# Patient Record
Sex: Male | Born: 1940 | Race: White | Hispanic: No | Marital: Married
Health system: Southern US, Community
[De-identification: ages and names within clinical notes are randomized; demographics above are authoritative.]

## PROBLEM LIST (undated history)

## (undated) DIAGNOSIS — M1990 Unspecified osteoarthritis, unspecified site: Secondary | ICD-10-CM

## (undated) DIAGNOSIS — E785 Hyperlipidemia, unspecified: Secondary | ICD-10-CM

## (undated) DIAGNOSIS — D229 Melanocytic nevi, unspecified: Secondary | ICD-10-CM

## (undated) DIAGNOSIS — C4491 Basal cell carcinoma of skin, unspecified: Secondary | ICD-10-CM

## (undated) DIAGNOSIS — I10 Essential (primary) hypertension: Secondary | ICD-10-CM

## (undated) DIAGNOSIS — C4492 Squamous cell carcinoma of skin, unspecified: Secondary | ICD-10-CM

## (undated) DIAGNOSIS — H3530 Unspecified macular degeneration: Secondary | ICD-10-CM

## (undated) DIAGNOSIS — H269 Unspecified cataract: Secondary | ICD-10-CM

## (undated) DIAGNOSIS — D099 Carcinoma in situ, unspecified: Secondary | ICD-10-CM

## (undated) HISTORY — DX: Unspecified macular degeneration: H35.30

## (undated) HISTORY — DX: Unspecified osteoarthritis, unspecified site: M19.90

## (undated) HISTORY — DX: Carcinoma in situ, unspecified: D09.9

## (undated) HISTORY — DX: Basal cell carcinoma of skin, unspecified: C44.91

## (undated) HISTORY — PX: PROSTATE SURGERY: SHX751

## (undated) HISTORY — PX: CATARACT EXTRACTION, BILATERAL: SHX1313

## (undated) HISTORY — DX: Hyperlipidemia, unspecified: E78.5

## (undated) HISTORY — DX: Melanocytic nevi, unspecified: D22.9

## (undated) HISTORY — DX: Unspecified cataract: H26.9

## (undated) HISTORY — DX: Squamous cell carcinoma of skin, unspecified: C44.92

---
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
## 1983-02-17 HISTORY — PX: BACK SURGERY: SHX140

## 1983-02-17 HISTORY — PX: INGUINAL HERNIA REPAIR: SUR1180

## 1997-02-16 HISTORY — PX: COLONOSCOPY: SHX174

## 2000-07-18 ENCOUNTER — Emergency Department (HOSPITAL_COMMUNITY): Admission: EM | Admit: 2000-07-18 | Discharge: 2000-07-18 | Payer: Self-pay | Admitting: Emergency Medicine

## 2001-02-16 HISTORY — PX: COLONOSCOPY: SHX174

## 2001-06-01 ENCOUNTER — Ambulatory Visit (HOSPITAL_COMMUNITY): Admission: RE | Admit: 2001-06-01 | Discharge: 2001-06-01 | Payer: Self-pay | Admitting: Internal Medicine

## 2002-08-23 DIAGNOSIS — C4491 Basal cell carcinoma of skin, unspecified: Secondary | ICD-10-CM

## 2002-08-23 HISTORY — DX: Basal cell carcinoma of skin, unspecified: C44.91

## 2002-10-24 DIAGNOSIS — D099 Carcinoma in situ, unspecified: Secondary | ICD-10-CM

## 2002-10-24 HISTORY — DX: Carcinoma in situ, unspecified: D09.9

## 2004-06-17 ENCOUNTER — Ambulatory Visit: Payer: Self-pay | Admitting: *Deleted

## 2004-06-20 ENCOUNTER — Ambulatory Visit: Payer: Self-pay | Admitting: *Deleted

## 2004-06-20 ENCOUNTER — Ambulatory Visit (HOSPITAL_COMMUNITY): Admission: RE | Admit: 2004-06-20 | Discharge: 2004-06-20 | Payer: Self-pay | Admitting: *Deleted

## 2004-07-02 ENCOUNTER — Ambulatory Visit: Payer: Self-pay | Admitting: *Deleted

## 2004-07-03 ENCOUNTER — Ambulatory Visit (HOSPITAL_COMMUNITY): Admission: RE | Admit: 2004-07-03 | Discharge: 2004-07-03 | Payer: Self-pay | Admitting: Family Medicine

## 2004-07-03 ENCOUNTER — Ambulatory Visit: Payer: Self-pay | Admitting: *Deleted

## 2004-07-10 ENCOUNTER — Ambulatory Visit: Payer: Self-pay | Admitting: *Deleted

## 2006-01-04 ENCOUNTER — Ambulatory Visit: Payer: Self-pay | Admitting: Cardiovascular Disease

## 2007-09-12 ENCOUNTER — Encounter: Admission: RE | Admit: 2007-09-12 | Discharge: 2007-09-12 | Payer: Self-pay | Admitting: Otolaryngology

## 2008-03-30 ENCOUNTER — Ambulatory Visit: Payer: Self-pay | Admitting: Cardiology

## 2008-04-18 ENCOUNTER — Ambulatory Visit (HOSPITAL_COMMUNITY): Admission: RE | Admit: 2008-04-18 | Discharge: 2008-04-18 | Payer: Self-pay | Admitting: Family Medicine

## 2008-09-04 DIAGNOSIS — D229 Melanocytic nevi, unspecified: Secondary | ICD-10-CM

## 2008-09-04 HISTORY — DX: Melanocytic nevi, unspecified: D22.9

## 2009-04-08 ENCOUNTER — Encounter: Payer: Self-pay | Admitting: Cardiology

## 2009-04-30 ENCOUNTER — Encounter: Payer: Self-pay | Admitting: Internal Medicine

## 2009-05-09 ENCOUNTER — Encounter (INDEPENDENT_AMBULATORY_CARE_PROVIDER_SITE_OTHER): Payer: Self-pay | Admitting: *Deleted

## 2009-05-09 DIAGNOSIS — E785 Hyperlipidemia, unspecified: Secondary | ICD-10-CM | POA: Insufficient documentation

## 2009-05-10 ENCOUNTER — Ambulatory Visit (HOSPITAL_COMMUNITY): Admission: RE | Admit: 2009-05-10 | Discharge: 2009-05-10 | Payer: Self-pay | Admitting: Internal Medicine

## 2009-05-10 ENCOUNTER — Ambulatory Visit: Payer: Self-pay | Admitting: Internal Medicine

## 2009-05-10 HISTORY — PX: COLONOSCOPY: SHX174

## 2009-05-16 ENCOUNTER — Encounter: Payer: Self-pay | Admitting: Cardiology

## 2009-05-16 LAB — CONVERTED CEMR LAB
Alkaline Phosphatase: 92 units/L (ref 39–117)
CO2: 24 meq/L (ref 19–32)
Glucose, Bld: 99 mg/dL (ref 70–99)
Potassium: 4.1 meq/L (ref 3.5–5.3)
Total Bilirubin: 0.5 mg/dL (ref 0.3–1.2)
Triglycerides: 129 mg/dL (ref <150)

## 2009-05-21 ENCOUNTER — Encounter (INDEPENDENT_AMBULATORY_CARE_PROVIDER_SITE_OTHER): Payer: Self-pay | Admitting: *Deleted

## 2009-09-16 ENCOUNTER — Telehealth (INDEPENDENT_AMBULATORY_CARE_PROVIDER_SITE_OTHER): Payer: Self-pay | Admitting: *Deleted

## 2010-03-18 NOTE — Progress Notes (Signed)
Summary: rx refill  Medications Added LIPITOR 20 MG TABS (ATORVASTATIN CALCIUM) Take one tablet by mouth daily. ASPIR-LOW 81 MG TBEC (ASPIRIN) take 1 tab daily CALCIUM-VITAMIN D 250-125 MG-UNIT TABS (CALCIUM CARBONATE-VITAMIN D) take 1 tab daily VITAMIN E 1000 UNIT CAPS (VITAMIN E) take 1 tab daily TYLENOL 325 MG TABS (ACETAMINOPHEN) take as needed OCUVITE PRESERVISION  TABS (MULTIPLE VITAMINS-MINERALS) take 2 tabs two times a day       Phone Note Call from Patient Call back at [phone]   Caller: pt Reason for Call: Refill Medication Summary of Call: pt needs lipitor 20 mg [facility] [place]. Initial call taken by: Faythe Ghee,  September 16, 2009 12:23 PM  Follow-up for Phone Call        [facility] for pt to call our office for a follow up appt.  This is his 2nd refill, we will not refill this med again without an appt Follow-up by: Teressa Lower RN,  September 16, 2009 1:08 PM    Prescriptions: LIPITOR 20 MG TABS (ATORVASTATIN CALCIUM) Take one tablet by mouth daily.  #30 x 0   Entered by:   Teressa Lower RN   Authorized by:   Kathlen Brunswick, MD, [facility]   Signed by:   Teressa Lower RN on 09/16/2009   Method used:   Electronically to        [facility] (retail)       [address]       [place], [place]  [address]       Ph: [phone]       Fax: [phone]   RxID:   [id number]

## 2010-03-18 NOTE — Letter (Signed)
Summary: [place] [facility] Work Engineer, agricultural at [employer]  [address] [place] [address]   Phone: [phone]  Fax: [phone]     May 09, 2009 MRN: [id number]   ODYN TURKO [address] [place], [place]  [address]      YOUR LAB WORK IS DUE   _____________March 28, 2011____________________________  Please go to [facility], located across the street from [facility] on the second floor.  Hours are Monday - Friday 7am until 7:30pm         Saturday 8am until 12noon    _x_  DO NOT EAT OR DRINK AFTER MIDNIGHT EVENING PRIOR TO LABWORK  __ YOUR LABWORK IS NOT FASTING --YOU MAY EAT PRIOR TO LABWORK

## 2010-03-18 NOTE — Letter (Signed)
Summary: [place] Results Engineer, agricultural at [facility]  [address] [place] [address]   Phone: [phone]  Fax: [phone]      May 21, 2009 MRN: [id number]   Jose King [address] [place], [place]  [address]   Dear Mr. Hilliker,  Your test ordered by Selena Batten has been reviewed by your physician (or physician assistant) and was found to be normal or stable. Your physician (or physician assistant) felt no changes were needed at this time.  ____ Echocardiogram  ____ Cardiac Stress Test  __x__ Lab Work  ____ Peripheral vascular study of arms, legs or neck  ____ CT scan or X-ray  ____ Lung or Breathing test  ____ Other:  No change in medical treatment at this time, per Dr. Dietrich Pates.  Enclosed is a copy of your labwork for your records.  Thank you, Jose King Allyne Gee RN    Hanover Bing, MD, [facility] Arena.C.Gaylord Shih, MD, F.A.C.C Lewayne Bunting, MD, F.A.C.C Nona Dell, MD, F.A.C.C Charlton Haws, MD, Lenise Arena.C.C

## 2010-03-18 NOTE — Miscellaneous (Signed)
Summary: lipitor refill  Clinical Lists Changes  Medications: Added new medication of LIPITOR 20 MG TABS (ATORVASTATIN CALCIUM) Take one tablet by mouth daily. - Signed Rx of LIPITOR 20 MG TABS (ATORVASTATIN CALCIUM) Take one tablet by mouth daily.;  #30 x 1;  Signed;  Entered by: Teressa Lower RN;  Authorized by: Kathlen Brunswick, MD, [facility];  Method used: Electronically to [facility] [address] [place], [place], [place]  [address], Ph: [phone], Fax: [phone]    Prescriptions: LIPITOR 20 MG TABS (ATORVASTATIN CALCIUM) Take one tablet by mouth daily.  #30 x 1   Entered by:   Teressa Lower RN   Authorized by:   Kathlen Brunswick, MD, [facility]   Signed by:   Teressa Lower RN on 04/08/2009   Method used:   Electronically to        [facility] (retail)       [address]       [place], [place]  [address]       Ph: [phone]       Fax: [phone]   RxID:   [id number]

## 2010-03-18 NOTE — Letter (Signed)
Summary: Internal Other Domingo Dimes  Internal Other Domingo Dimes   Imported By: Cloria Spring LPN 16/11/9602 54:09:81  _____________________________________________________________________  External Attachment:    Type:   Image     Comment:   External Document

## 2010-07-01 NOTE — Letter (Signed)
March 30, 2008    Angus G. Renard Matter, MD  [address]  [place], [place] [address]   RE:  PARMVIR, BOOMER  MRN:  [id number]  /  DOB:  Apr 09, 1940   Dear Thalia Party,   Mr. Mcgann returns to the office after a 2-1/2-year hiatus.  He was  scheduled to see Dr. Eden Emms back in 2008, but missed that appointment.  We are in the process of updating our records and called him in because  he was overdue for followup.  As you know, he has done quite well in the  intervening years.  He is very active including splitting wood with a  hand maul with no difficulty.  He was originally evaluated for  hyperlipidemia with a strong family history for coronary artery disease.  Most of the members of his family who suffered myocardial infarction,  they used tobacco products, and he does not.  He has continued to take  atorvastatin 20 mg daily, but has not had a recent lipid profile.  He  also takes aspirin 81 mg daily, calcium with vitamin D, vitamin E, and a  vitamin tablet designed to forestall visual problems.   PHYSICAL EXAMINATION:  GENERAL:  A very pleasant gentleman in no acute  distress.  VITAL SIGNS:  The weight is 198, 4 pounds more than in 2007.  Blood  pressure 120/80, heart rate 70 and regular, respirations 14 and  unlabored.  NECK:  Slight jugular venous distention; normal carotid upstrokes  without bruits.  LUNGS:  Clear.  CARDIAC:  Normal first and second heart sounds; fourth heart sound  present; minimal systolic murmur.  ABDOMEN:  Soft and nontender; no organomegaly; no bruits.  EXTREMITIES:  Distal pulses are intact; no edema.   EKG:  Normal sinus rhythm; delayed R-wave progression - I cannot exclude  previous anteroseptal MI.  No change when compared to a previous tracing  of Jun 17, 2004.  An echocardiogram at that time showed normal wall  motion throughout the left ventricle.   IMPRESSION:  Mr. Leeman is continuing to maintain a healthy lifestyle  and continues to have no  signs or symptoms of vascular or cardiac  disease.  We will check a lipid profile and hepatic profile.  If results  are good, I will not plan routine Cardiology followup.  Please let me  know at any time that I can assist in the care of this very nice  gentleman.    Sincerely,      Gerrit Friends. Dietrich Pates, MD, [facility]  Electronically Signed    RMR/MedQ  DD: 03/30/2008  DT: 03/31/2008  Job #: [id number]

## 2010-07-04 NOTE — Op Note (Signed)
[facility]  Patient:    Jose King, Jose King Visit Number: [id number] MRN: [id number]          Service Type: END Location: DAY Attending Physician:  Jonathon Bellows Dictated by:   Roetta Sessions, M.D. Proc. Date: 06/01/01 Admit Date:  06/01/2001   CC:         Butch Penny, M.D.   Operative Report  PROCEDURE:  Surveillance colonoscopy.  ENDOSCOPIST:  Roetta Sessions, M.D.  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 Colonoscopy is now being done as a surveillance maneuver.  This approach has been discussed with the patient.  He has not had any bowel problems since being seen in 1999.  Please see my handwritten H&P for more information.  DESCRIPTION OF PROCEDURE:  O2 saturation, blood pressure, pulse and respirations were monitored throughout the entirety of the procedure.  CONSCIOUS SEDATION:  Versed 2 mg IV, Demerol 50 mg IV, atropine 0.5 mg IV prior to beginning the procedure.  INSTRUMENT:  Olympus video-chip colonoscope.  FINDINGS:  Digital rectal examination revealed no abnormalities.  ENDOSCOPIC FINDINGS:  The prep was good.  Rectum:  Examination of the rectal mucosa including a retroflexed view of the anal verge revealed no abnormalities.  Colon:  The colonic mucosa was surveyed from the rectosigmoid junction through the left, transverse and right colon to the area of the appendiceal orifice, ileocecal valve and cecum.  These structures were well-seen and photographed for the record.  No colonic mucosal abnormalities were noted upon advancing the scope to the cecum.  From the level of the cecum and ileocecal valve, the scope was slowly withdrawn and all previously mentioned mucosal surfaces were again seen and again, no abnormalities observed.  The patient  tolerated the procedure well and was reacted in endoscopy.  IMPRESSION: 1. Normal colon. 2. Normal rectum.  RECOMMENDATIONS:  Repeat colonoscopy in five years. Dictated by:   Roetta Sessions, M.D. Attending Physician:  Jonathon Bellows DD:  06/01/01 TD:  06/01/01 Job: [id number]

## 2010-07-04 NOTE — Procedures (Signed)
NAMESUEO, CULLEN NO.:  [id number]   MEDICAL RECORD NO.:  [id number]          PATIENT TYPE:  OUT   LOCATION:  RAD                           FACILITY:  [facility]   PHYSICIAN:  Vida Roller, M.D.   DATE OF BIRTH:  December 15, 1940   DATE OF PROCEDURE:  DATE OF DISCHARGE:  06/20/2004                                  ECHOCARDIOGRAM   PRIMARY CARE PHYSICIAN:  Angus G. Renard Matter, MD.   Hansel Starling NUMBER:  [id number].   TAPE COUNT:  [id number].   PROCEDURE:  Echocardiogram.   INDICATIONS FOR PROCEDURE:  For LV function.   DESCRIPTION OF PROCEDURE:  The technical quality of the study is adequate.   M-mode tracing of the aorta is 31 mm.   The left atrium is 45 mm.   The septum is 16 mm.   The posterior wall is 16 mm.   The left ventricular diastolic dimension is 40 mm.   The left ventricular systolic dimension is 29 mm.   2-D AND DOPPLER IMAGING:  The left ventricle is a normal size with normal  systolic function.  Estimated ejection fraction is 60-65%.  There are no  obvious wall motion abnormalities.  There was moderate concentric left  ventricular hypertrophy.   The right ventricle is top normal in size with normal systolic function.   Both atria are enlarged.   The aortic valve is sclerotic with no stenosis or regurgitation.   The mitral valve is morphologically unremarkable with no stenosis or  regurgitation.   The tricuspid valve is morphologically unremarkable with no stenosis or  regurgitation.   The pulmonary valve is not well seen.   The inferior vena cava is a normal size.   No pericardial effusion.   The ascending aorta is normal per limits of the study.      JH/MEDQ  D:  07/03/2004  T:  07/03/2004  Job:  [id number]

## 2010-07-04 NOTE — Assessment & Plan Note (Signed)
[facility]                         [place] CARDIOLOGY OFFICE NOTE   CURRY, SEEFELDT                     MRN:          [id number]  DATE:01/04/2006                            DOB:          [birth year]    The patient is seen today as a new patient by me.  He was previously seen by  Dr. Dorethea Clan.  He has a markedly positive family history for coronary disease  with two brothers having MIs in their 52s.   He had a nonischemic Myoview in May 2006.  He has borderline hypertension.  He has been watching the salt in his diet and he is not on any blood  pressure medications.  He has hyperlipidemia.  November 22, 2005, his LDL was  98.  His LFTs were normal.  He is only on 10 of Lipitor.   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  His wife is a patient here  as well.  I think she is going to be established with Dr. Tenny Craw.  She has had  a myocardial infarction.  Although the patient is retired he is still very  busy.  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  He  is involved with the church and travels quite a bit.   REVIEW OF SYSTEMS:  There has been no significant chest pain, PND, or  orthopnea, no lower extremity edema, and no chest pain.   He has been taking a baby aspirin a day and Lipitor 10 mg a day.   EXAMINATION TODAY:  VITAL SIGNS:  The blood pressure was 130/80, pulse 64  and regular.  HEENT:  Normal.  There is no lymphadenopathy, no carotid bruits.  LUNGS:  Clear.  HEART:  There is an S1 and S2 with normal heart sounds.  ABDOMEN:  Benign.  LOWER EXTREMITIES:  Intact pulses, no edema.  NEUROLOGIC:  Nonfocal.  SKIN:  Warm and dry.   IMPRESSION:  Borderline hypertension.  Continue home monitoring.  No  indication for treatment at this time.  Back in May 2006 he did have a bit  of septal hypertrophy.  We will do a followup  echocardiogram in about 2  years.   I told Demetreus he would be an excellent candidate for a cardiac CT to get a  calcium score and rule out coronary disease.  Given the fact that he has  already had a Myoview in May 2006 this can wait until May 2008 or May 2009.  I think the calcium score would help [place] identify how  aggressive to be with his Lipitor.  For the time being I told him to  increase his Lipitor to 20 mg a  day.  He will let us know if his memory or if any of his aches and pains get  worse while on the Lipitor.  His LFTs were normal last month.  I will see  him back in about 6 months.     Noralyn Pick. Eden Emms, MD,  [facility]  Electronically Signed    PCN/MedQ  DD: 01/04/2006  DT: 01/04/2006  Job #: [id number]

## 2010-10-09 ENCOUNTER — Encounter: Payer: Self-pay | Admitting: Cardiovascular Disease

## 2012-04-29 ENCOUNTER — Other Ambulatory Visit (HOSPITAL_COMMUNITY): Payer: Self-pay | Admitting: Family Medicine

## 2012-04-29 ENCOUNTER — Ambulatory Visit (HOSPITAL_COMMUNITY)
Admission: RE | Admit: 2012-04-29 | Discharge: 2012-04-29 | Disposition: A | Discharge status: Home / Self Care | Payer: Medicare Other | Source: Ambulatory Visit | Attending: Family Medicine | Admitting: Family Medicine

## 2012-04-29 DIAGNOSIS — F17200 Nicotine dependence, unspecified, uncomplicated: Secondary | ICD-10-CM

## 2012-04-29 DIAGNOSIS — R0989 Other specified symptoms and signs involving the circulatory and respiratory systems: Secondary | ICD-10-CM | POA: Insufficient documentation

## 2012-08-03 DIAGNOSIS — C4491 Basal cell carcinoma of skin, unspecified: Secondary | ICD-10-CM

## 2012-08-03 HISTORY — DX: Basal cell carcinoma of skin, unspecified: C44.91

## 2013-02-17 ENCOUNTER — Encounter: Payer: Medicare Other | Admitting: Cardiology

## 2013-02-17 ENCOUNTER — Encounter: Payer: Self-pay | Admitting: Cardiology

## 2013-02-17 DIAGNOSIS — I4949 Other premature depolarization: Secondary | ICD-10-CM | POA: Insufficient documentation

## 2013-02-17 NOTE — Progress Notes (Signed)
Rescheduled This encounter was created in error - please disregard. 

## 2013-02-24 ENCOUNTER — Encounter: Payer: Self-pay | Admitting: Cardiology

## 2013-02-24 ENCOUNTER — Ambulatory Visit (INDEPENDENT_AMBULATORY_CARE_PROVIDER_SITE_OTHER): Payer: Medicare Other | Admitting: Cardiology

## 2013-02-24 VITALS — BP 160/81 | HR 68 | Ht 71.0 in | Wt 199.0 lb

## 2013-02-24 DIAGNOSIS — E785 Hyperlipidemia, unspecified: Secondary | ICD-10-CM

## 2013-02-24 DIAGNOSIS — I4949 Other premature depolarization: Secondary | ICD-10-CM

## 2013-02-24 NOTE — Patient Instructions (Signed)
Continue all current medications. Follow up as needed  

## 2013-02-24 NOTE — Progress Notes (Signed)
    Clinical Summary Mr. Lindahl is a 73 y.o.male referred for cardiology consultation by Dr. Everette Rank. 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 No old ECG available for review.  He is here with his wife. He reports no symptoms of palpitations, dizziness, chest pain, or syncope. He stays active with outdoor work, reports no major functional limitations. These chores include operating heavy equipment and also splitting wood and using a chain saw.  Record review finds prior cardiac evaluation by Dr. Johnsie Cancel back in 2007. He underwent a Myoview study in May 2006 that was negative for ischemia.  ECG today shows normal sinus rhythm.  No Known Allergies  Current Outpatient Prescriptions  Medication Sig Dispense Refill  . acetaminophen (TYLENOL) 325 MG tablet Take 325 mg by mouth as needed.        Marland Kitchen aspirin 81 MG tablet Take 81 mg by mouth daily.        Marland Kitchen atorvastatin (LIPITOR) 20 MG tablet Take 20 mg by mouth daily.        . calcium-vitamin D (OSCAL) 250-125 MG-UNIT per tablet Take 1 tablet by mouth 3 (three) times a week.       . Multiple Vitamins-Minerals (OCUVITE PRESERVISION) TABS Take 1 tablet by mouth 2 (two) times daily.        No current facility-administered medications for this visit.    Past Medical History  Diagnosis Date  . Hyperlipidemia     Past Surgical History  Procedure Laterality Date  . Back surgery  1985    Family History  Problem Relation Age of Onset  . Cancer Mother   . Heart attack Father 32  . Suicidality Brother     Social History Mr. Pfarr has no tobacco history on file. Mr. Torosyan reports that he does not drink alcohol.  Review of Systems Negative except as outlined.  Physical Examination Filed Vitals:   02/24/13 0900  BP: 160/81  Pulse: 68   Filed Weights   02/24/13 0900  Weight: 199 lb (90.266 kg)   Patient appears comfortable at rest. HEENT: Conjunctiva and lids normal, oropharynx clear. Neck: Supple, no  elevated JVP or carotid bruits, no thyromegaly. Lungs: Clear to auscultation, nonlabored breathing at rest. Cardiac: Regular rate and rhythm, no S3 or significant systolic murmur, no pericardial rub. Abdomen: Soft, nontender, bowel sounds present. Extremities: No pitting edema, distal pulses 2+. Skin: Warm and dry. Musculoskeletal: No kyphosis. Neuropsychiatric: Alert and oriented x3, affect grossly appropriate.   Problem List and Plan   Ectopic beats Reported at recent DOT exam, but not present today on exam or by ECG, and patient is asymptomatic as noted above. Would not anticipate any further workup at this point. We discussed being observant for any development of symptoms that might want further evaluation such as palpitations, chest pain, dizziness, or shortness of breath. Keep regular followup with Dr. Everette Rank.  HYPERLIPIDEMIA On Lipitor, followed by Dr. Everette Rank.    Satira Sark, M.D., F.A.C.C.

## 2013-02-24 NOTE — Assessment & Plan Note (Signed)
On Lipitor, followed by Dr. Everette Rank.

## 2013-02-24 NOTE — Assessment & Plan Note (Signed)
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 Would not anticipate any further workup at this point. We discussed being observant for any development of symptoms that might want further evaluation such as palpitations, chest pain, dizziness, or shortness of breath. Keep regular followup with Dr. Everette Rank.

## 2014-04-17 ENCOUNTER — Encounter: Payer: Self-pay | Admitting: Internal Medicine

## 2014-04-23 ENCOUNTER — Ambulatory Visit (INDEPENDENT_AMBULATORY_CARE_PROVIDER_SITE_OTHER): Payer: Medicare Other | Admitting: Gastroenterology

## 2014-04-23 ENCOUNTER — Other Ambulatory Visit: Payer: Self-pay

## 2014-04-23 ENCOUNTER — Encounter: Payer: Self-pay | Admitting: Gastroenterology

## 2014-04-23 VITALS — BP 158/91 | HR 86 | Temp 98.1°F | Ht 72.0 in | Wt 196.6 lb

## 2014-04-23 DIAGNOSIS — Z800 Family history of malignant neoplasm of digestive organs: Secondary | ICD-10-CM

## 2014-04-23 DIAGNOSIS — Z86010 Personal history of colonic polyps: Secondary | ICD-10-CM

## 2014-04-23 DIAGNOSIS — Z860101 Personal history of adenomatous and serrated colon polyps: Secondary | ICD-10-CM | POA: Insufficient documentation

## 2014-04-23 MED ORDER — PEG 3350-KCL-NA BICARB-NACL 420 G PO SOLR: 4000.0000 mL | Freq: Once | ORAL | Status: DC

## 2014-04-23 NOTE — Assessment & Plan Note (Signed)
74 year old gentleman with family history significant for colon cancer (mother at age 40), personal history of adenomatous colon polyps who presents for surveillance colonoscopy. He is doing well. I have discussed the risks, alternatives, benefits with regards to but not limited to the risk of reaction to medication, bleeding, infection, perforation and the patient is agreeable to proceed. Written consent to be obtained.

## 2014-04-23 NOTE — Progress Notes (Signed)
Primary Care Physician:  Lanette Hampshire, MD  Primary Gastroenterologist:  Garfield Cornea, MD   Chief Complaint  Patient presents with  . Colonoscopy    HPI:  Jose King is a 74 y.o. male here to schedule surveillance colonoscopy. He has a personal history of adenomatous colon polyps. His mother died of colon cancer at age 63. His last colonoscopy was in 2011 as outlined below. He is doing well. Denies any bowel issues. Bowel movements are regular. No blood in the stool or melena. Denies abdominal pain. No heartburn. No dysphagia. Weight is stable.  Current Outpatient Prescriptions  Medication Sig Dispense Refill  . acetaminophen (TYLENOL) 325 MG tablet Take 325 mg by mouth as needed.      Marland Kitchen aspirin 81 MG tablet Take 81 mg by mouth daily.      Marland Kitchen atorvastatin (LIPITOR) 20 MG tablet Take 20 mg by mouth daily.      . calcium-vitamin D (OSCAL) 250-125 MG-UNIT per tablet Take 1 tablet by mouth 3 (three) times a week.     . Multiple Vitamins-Minerals (OCUVITE PRESERVISION) TABS Take 1 tablet by mouth 2 (two) times daily.      No current facility-administered medications for this visit.    Allergies as of 04/23/2014  . (No Known Allergies)    Past Medical History  Diagnosis Date  . Hyperlipidemia     Past Surgical History  Procedure Laterality Date  . Back surgery  1985  . Colonoscopy  05/10/2009    RMR: normal. repeat 5 years  . Colonoscopy  2003    RMR: normal  . Colonoscopy  1999    RMR: adenomatous ICV    Family History  Problem Relation Age of Onset  . Cancer Mother 66    colon  . Heart attack Father 29  . Suicidality Brother   . Lymphoma Brother   . Cancer Other     grandfather, mouth  . Cancer Other     grandmother, intestional     History   Social History  . Marital Status: Married    Spouse Name: N/A  . Number of Children: 0  . Years of Education: N/A   Occupational History  . Retired    Social History Main Topics  . Smoking status: Unknown If  Ever Smoked  . Smokeless tobacco: Not on file  . Alcohol Use: No  . Drug Use: No  . Sexual Activity: Not on file   Other Topics Concern  . Not on file   Social History Narrative   Married   Gets regular exercise      ROS:  General: Negative for anorexia, weight loss, fever, chills, fatigue, weakness. Eyes: Negative for vision changes.  ENT: Negative for hoarseness, difficulty swallowing , nasal congestion. CV: Negative for chest pain, angina, palpitations, dyspnea on exertion, peripheral edema.  Respiratory: Negative for dyspnea at rest, dyspnea on exertion, cough, sputum, wheezing.  GI: See history of present illness. GU:  Negative for dysuria, hematuria, urinary incontinence, urinary frequency, nocturnal urination.  MS: Negative for joint pain, low back pain.  Derm: Negative for rash or itching.  Neuro: Negative for weakness, abnormal sensation, seizure, frequent headaches, memory loss, confusion.  Psych: Negative for anxiety, depression, suicidal ideation, hallucinations.  Endo: Negative for unusual weight change.  Heme: Negative for bruising or bleeding. Allergy: Negative for rash or hives.    Physical Examination:  BP 158/91 mmHg  Pulse 86  Temp(Src) 98.1 F (36.7 C) (Oral)  Ht 6' (1.829 m)  Wt 196 lb 9.6 oz (89.177 kg)  BMI 26.66 kg/m2   General: Well-nourished, well-developed in no acute distress.  Head: Normocephalic, atraumatic.   Eyes: Conjunctiva pink, no icterus. Mouth: Oropharyngeal mucosa moist and pink , no lesions erythema or exudate. Neck: Supple without thyromegaly, masses, or lymphadenopathy.  Lungs: Clear to auscultation bilaterally.  Heart: Regular rate and rhythm, no murmurs rubs or gallops.  Abdomen: Bowel sounds are normal, nontender, nondistended, no hepatosplenomegaly or masses, no abdominal bruits or    hernia , no rebound or guarding.   Rectal: Not performed Extremities: No lower extremity edema. No clubbing or deformities.  Neuro:  Alert and oriented x 4 , grossly normal neurologically.  Skin: Warm and dry, no rash or jaundice.   Psych: Alert and cooperative, normal mood and affect.

## 2014-04-23 NOTE — Progress Notes (Signed)
cc'ed to pcp °

## 2014-04-23 NOTE — Patient Instructions (Signed)
1. Colonoscopy with Dr. Rourk. See separate instructions.  

## 2014-05-09 ENCOUNTER — Ambulatory Visit (HOSPITAL_COMMUNITY)
Admission: RE | Admit: 2014-05-09 | Discharge: 2014-05-09 | Disposition: A | Discharge status: Home / Self Care | Payer: Medicare Other | Source: Ambulatory Visit | Attending: Internal Medicine | Admitting: Internal Medicine

## 2014-05-09 ENCOUNTER — Encounter (HOSPITAL_COMMUNITY)
Admission: RE | Disposition: A | Discharge status: Home / Self Care | Payer: Self-pay | Source: Ambulatory Visit | Attending: Internal Medicine

## 2014-05-09 ENCOUNTER — Encounter (HOSPITAL_COMMUNITY): Payer: Self-pay | Admitting: *Deleted

## 2014-05-09 DIAGNOSIS — D123 Benign neoplasm of transverse colon: Secondary | ICD-10-CM

## 2014-05-09 DIAGNOSIS — E785 Hyperlipidemia, unspecified: Secondary | ICD-10-CM | POA: Insufficient documentation

## 2014-05-09 DIAGNOSIS — Z860100 Personal history of colon polyps, unspecified: Secondary | ICD-10-CM | POA: Insufficient documentation

## 2014-05-09 DIAGNOSIS — Z7982 Long term (current) use of aspirin: Secondary | ICD-10-CM | POA: Insufficient documentation

## 2014-05-09 DIAGNOSIS — Z800 Family history of malignant neoplasm of digestive organs: Secondary | ICD-10-CM | POA: Diagnosis not present

## 2014-05-09 DIAGNOSIS — Z08 Encounter for follow-up examination after completed treatment for malignant neoplasm: Secondary | ICD-10-CM | POA: Diagnosis present

## 2014-05-09 HISTORY — PX: COLONOSCOPY: SHX5424

## 2014-05-09 SURGERY — COLONOSCOPY
Anesthesia: Moderate Sedation

## 2014-05-09 MED ORDER — MEPERIDINE HCL 100 MG/ML IJ SOLN
INTRAMUSCULAR | Status: AC
  Filled 2014-05-09: qty 2

## 2014-05-09 MED ORDER — MIDAZOLAM HCL 5 MG/5ML IJ SOLN
INTRAMUSCULAR | Status: DC | PRN
  Administered 2014-05-09: 2 mg via INTRAVENOUS
  Administered 2014-05-09 (×3): 1 mg via INTRAVENOUS

## 2014-05-09 MED ORDER — ONDANSETRON HCL 4 MG/2ML IJ SOLN
INTRAMUSCULAR | Status: AC
  Filled 2014-05-09: qty 2

## 2014-05-09 MED ORDER — ONDANSETRON HCL 4 MG/2ML IJ SOLN
INTRAMUSCULAR | Status: DC | PRN
  Administered 2014-05-09: 4 mg via INTRAVENOUS

## 2014-05-09 MED ORDER — MIDAZOLAM HCL 5 MG/5ML IJ SOLN
INTRAMUSCULAR | Status: AC
  Filled 2014-05-09: qty 10

## 2014-05-09 MED ORDER — MEPERIDINE HCL 100 MG/ML IJ SOLN
INTRAMUSCULAR | Status: DC | PRN
  Administered 2014-05-09: 50 mg via INTRAVENOUS

## 2014-05-09 MED ORDER — SODIUM CHLORIDE 0.9 % IV SOLN
INTRAVENOUS | Status: DC
  Administered 2014-05-09: 1000 mL via INTRAVENOUS

## 2014-05-09 NOTE — Interval H&P Note (Signed)
History and Physical Interval Note:  05/09/2014 9:16 AM  Jose King  has presented today for surgery, with the diagnosis of family history of colon cancer, history of colon polps  The various methods of treatment have been discussed with the patient and family. After consideration of risks, benefits and other options for treatment, the patient has consented to  Procedure(s) with comments: COLONOSCOPY (N/A) - 9:15am as a surgical intervention .  The patient's history has been reviewed, patient examined, no change in status, stable for surgery.  I have reviewed the patient's chart and labs.  Questions were answered to the patient's satisfaction.     Jose King  No change. Surveillance colonoscopy per plan.The risks, benefits, limitations, alternatives and imponderables have been reviewed with the patient. Questions have been answered. All parties are agreeable.

## 2014-05-09 NOTE — Op Note (Signed)
[facility] [address] [place], [address]   COLONOSCOPY PROCEDURE REPORT  PATIENT: Jose King, Jose King  MR#: [id number] BIRTHDATE: 1940/11/27 , 73  yrs. old GENDER: male ENDOSCOPIST: R.  Garfield Cornea, MD FACP [facility] REFERRED SJ:GGEZM Everette Rank, M.D. PROCEDURE DATE:  02-Jun-2014 PROCEDURE:   Colonoscopy with snare polypectomy INDICATIONS:Positive family history of colon cancer; history of colonic adenoma. MEDICATIONS: Versed 5 mg IV and Demerol 50 mg IV in divided doses. Zofran 4 mg IV. ASA CLASS:       Class II  CONSENT: The risks, benefits, alternatives and imponderables including but not limited to bleeding, perforation as well as the possibility of a missed lesion have been reviewed.  The potential for biopsy, lesion removal, etc. have also been discussed. Questions have been answered.  All parties agreeable.  Please see the history and physical in the medical record for more information.  DESCRIPTION OF PROCEDURE:   After the risks benefits and alternatives of the procedure were thoroughly explained, informed consent was obtained.  The digital rectal exam revealed no abnormalities of the rectum.   The EC-[id number] ([id number])  endoscope was introduced through the anus and advanced to the cecum, which was identified by both the appendix and ileocecal valve. No adverse events experienced.   The quality of the prep was adequate  The instrument was then slowly withdrawn as the colon was fully examined.      COLON FINDINGS: Normal rectum.  (1) 5 mm peduncular polyp at the hepatic flexure; otherwise, the remainder of the colonic mucosa appeared normal.  The above-mentioned polyp was removed completely with cold snare cautery technique.  Retroflexion was performed. .  Withdrawal time=13 minutes 0 seconds.  The scope was withdrawn and the procedure completed. COMPLICATIONS: There were no immediate complications.  ENDOSCOPIC IMPRESSION: Single colonic  polyp?"removed as described above.  RECOMMENDATIONS: Follow-up on pathology.  eSigned:  R. Garfield Cornea, MD Rosalita Chessman [facility] Jun 02, 2014 9:54 AM   cc:  CPT CODES: ICD CODES:  The ICD and CPT codes recommended by this software are interpretations from the data that the clinical staff has captured with the software.  The verification of the translation of this report to the ICD and CPT codes and modifiers is the sole responsibility of the health care institution and practicing physician where this report was generated.  [facility] [place] [facility]. will not be held responsible for the validity of the ICD and CPT codes included on this report.  AMA assumes no liability for data contained or not contained herein. CPT is a Designer, television/film set of the [employer].

## 2014-05-09 NOTE — Discharge Instructions (Addendum)
°Colonoscopy °Discharge Instructions ° °Read the instructions outlined below and refer to this sheet in the next few weeks. These discharge instructions provide you with general information on caring for yourself after you leave the hospital. Your doctor may also give you specific instructions. While your treatment has been planned according to the most current medical practices available, unavoidable complications occasionally occur. If you have any problems or questions after discharge, call Dr. Rourk at [phone]. °ACTIVITY °· You may resume your regular activity, but move at a slower pace for the next 24 hours.  °· Take frequent rest periods for the next 24 hours.  °· Walking will help get rid of the air and reduce the bloated feeling in your belly (abdomen).  °· No driving for 24 hours (because of the medicine (anesthesia) used during the test).   °· Do not sign any important legal documents or operate any machinery for 24 hours (because of the anesthesia used during the test).  °NUTRITION °· Drink plenty of fluids.  °· You may resume your normal diet as instructed by your doctor.  °· Begin with a light meal and progress to your normal diet. Heavy or fried foods are harder to digest and may make you feel sick to your stomach (nauseated).  °· Avoid alcoholic beverages for 24 hours or as instructed.  °MEDICATIONS °· You may resume your normal medications unless your doctor tells you otherwise.  °WHAT YOU CAN EXPECT TODAY °· Some feelings of bloating in the abdomen.  °· Passage of more gas than usual.  °· Spotting of blood in your stool or on the toilet paper.  °IF YOU HAD POLYPS REMOVED DURING THE COLONOSCOPY: °· No aspirin products for 7 days or as instructed.  °· No alcohol for 7 days or as instructed.  °· Eat a soft diet for the next 24 hours.  °FINDING OUT THE RESULTS OF YOUR TEST °Not all test results are available during your visit. If your test results are not back during the visit, make an appointment  with your caregiver to find out the results. Do not assume everything is normal if you have not heard from your caregiver or the medical facility. It is important for you to follow up on all of your test results.  °SEEK IMMEDIATE MEDICAL ATTENTION IF: °· You have more than a spotting of blood in your stool.  °· Your belly is swollen (abdominal distention).  °· You are nauseated or vomiting.  °· You have a temperature over 101.  °· You have abdominal pain or discomfort that is severe or gets worse throughout the day.  ° ° °Polyp information provided ° °Further recommendations to follow pending review of pathology report ° °Colon Polyps °Polyps are lumps of extra tissue growing inside the body. Polyps can grow in the large intestine (colon). Most colon polyps are noncancerous (benign). However, some colon polyps can become cancerous over time. Polyps that are larger than a pea may be harmful. To be safe, caregivers remove and test all polyps. °CAUSES  °Polyps form when mutations in the genes cause your cells to grow and divide even though no more tissue is needed. °RISK FACTORS °There are a number of risk factors that can increase your chances of getting colon polyps. They include: °· Being older than 50 years. °· Family history of colon polyps or colon cancer. °· Long-term colon diseases, such as colitis or Crohn disease. °· Being overweight. °· Smoking. °· Being inactive. °· Drinking too much alcohol. °SYMPTOMS  °  Most small polyps do not cause symptoms. If symptoms are present, they may include: °· Blood in the stool. The stool may look dark red or black. °· Constipation or diarrhea that lasts longer than 1 week. °DIAGNOSIS °People often do not know they have polyps until their caregiver finds them during a regular checkup. Your caregiver can use 4 tests to check for polyps: °· Digital rectal exam. The caregiver wears gloves and feels inside the rectum. This test would find polyps only in the rectum. °· Barium  enema. The caregiver puts a liquid called barium into your rectum before taking X-rays of your colon. Barium makes your colon look white. Polyps are dark, so they are easy to see in the X-ray pictures. °· Sigmoidoscopy. A thin, flexible tube (sigmoidoscope) is placed into your rectum. The sigmoidoscope has a light and tiny camera in it. The caregiver uses the sigmoidoscope to look at the last third of your colon. °· Colonoscopy. This test is like sigmoidoscopy, but the caregiver looks at the entire colon. This is the most common method for finding and removing polyps. °TREATMENT  °Any polyps will be removed during a sigmoidoscopy or colonoscopy. The polyps are then tested for cancer. °PREVENTION  °To help lower your risk of getting more colon polyps: °· Eat plenty of fruits and vegetables. Avoid eating fatty foods. °· Do not smoke. °· Avoid drinking alcohol. °· Exercise every day. °· Lose weight if recommended by your caregiver. °· Eat plenty of calcium and folate. Foods that are rich in calcium include milk, cheese, and broccoli. Foods that are rich in folate include chickpeas, kidney beans, and spinach. °HOME CARE INSTRUCTIONS °Keep all follow-up appointments as directed by your caregiver. You may need periodic exams to check for polyps. °SEEK MEDICAL CARE IF: °You notice bleeding during a bowel movement. °Document Released: 10/30/2003 Document Revised: 04/27/2011 Document Reviewed: 04/14/2011 °ExitCare® Patient Information ©2015 [employer]. This information is not intended to replace advice given to you by your health care provider. Make sure you discuss any questions you have with your health care provider. ° °

## 2014-05-09 NOTE — H&P (View-Only) (Signed)
Primary Care Physician:  Lanette Hampshire, MD  Primary Gastroenterologist:  Garfield Cornea, MD   Chief Complaint  Patient presents with  . Colonoscopy    HPI:  ARCHER MOIST is a 74 y.o. male here to schedule surveillance colonoscopy. He has a personal history of adenomatous colon polyps. His mother died of colon cancer at age 73. His last colonoscopy was in 2011 as outlined below. He is doing well. Denies any bowel issues. Bowel movements are regular. No blood in the stool or melena. Denies abdominal pain. No heartburn. No dysphagia. Weight is stable.  Current Outpatient Prescriptions  Medication Sig Dispense Refill  . acetaminophen (TYLENOL) 325 MG tablet Take 325 mg by mouth as needed.      Marland Kitchen aspirin 81 MG tablet Take 81 mg by mouth daily.      Marland Kitchen atorvastatin (LIPITOR) 20 MG tablet Take 20 mg by mouth daily.      . calcium-vitamin D (OSCAL) 250-125 MG-UNIT per tablet Take 1 tablet by mouth 3 (three) times a week.     . Multiple Vitamins-Minerals (OCUVITE PRESERVISION) TABS Take 1 tablet by mouth 2 (two) times daily.      No current facility-administered medications for this visit.    Allergies as of 04/23/2014  . (No Known Allergies)    Past Medical History  Diagnosis Date  . Hyperlipidemia     Past Surgical History  Procedure Laterality Date  . Back surgery  1985  . Colonoscopy  05/10/2009    RMR: normal. repeat 5 years  . Colonoscopy  2003    RMR: normal  . Colonoscopy  1999    RMR: adenomatous ICV    Family History  Problem Relation Age of Onset  . Cancer Mother 46    colon  . Heart attack Father 44  . Suicidality Brother   . Lymphoma Brother   . Cancer Other     grandfather, mouth  . Cancer Other     grandmother, intestional     History   Social History  . Marital Status: Married    Spouse Name: N/A  . Number of Children: 0  . Years of Education: N/A   Occupational History  . Retired    Social History Main Topics  . Smoking status: Unknown If  Ever Smoked  . Smokeless tobacco: Not on file  . Alcohol Use: No  . Drug Use: No  . Sexual Activity: Not on file   Other Topics Concern  . Not on file   Social History Narrative   Married   Gets regular exercise      ROS:  General: Negative for anorexia, weight loss, fever, chills, fatigue, weakness. Eyes: Negative for vision changes.  ENT: Negative for hoarseness, difficulty swallowing , nasal congestion. CV: Negative for chest pain, angina, palpitations, dyspnea on exertion, peripheral edema.  Respiratory: Negative for dyspnea at rest, dyspnea on exertion, cough, sputum, wheezing.  GI: See history of present illness. GU:  Negative for dysuria, hematuria, urinary incontinence, urinary frequency, nocturnal urination.  MS: Negative for joint pain, low back pain.  Derm: Negative for rash or itching.  Neuro: Negative for weakness, abnormal sensation, seizure, frequent headaches, memory loss, confusion.  Psych: Negative for anxiety, depression, suicidal ideation, hallucinations.  Endo: Negative for unusual weight change.  Heme: Negative for bruising or bleeding. Allergy: Negative for rash or hives.    Physical Examination:  BP 158/91 mmHg  Pulse 86  Temp(Src) 98.1 F (36.7 C) (Oral)  Ht 6' (1.829 m)  Wt 196 lb 9.6 oz (89.177 kg)  BMI 26.66 kg/m2   General: Well-nourished, well-developed in no acute distress.  Head: Normocephalic, atraumatic.   Eyes: Conjunctiva pink, no icterus. Mouth: Oropharyngeal mucosa moist and pink , no lesions erythema or exudate. Neck: Supple without thyromegaly, masses, or lymphadenopathy.  Lungs: Clear to auscultation bilaterally.  Heart: Regular rate and rhythm, no murmurs rubs or gallops.  Abdomen: Bowel sounds are normal, nontender, nondistended, no hepatosplenomegaly or masses, no abdominal bruits or    hernia , no rebound or guarding.   Rectal: Not performed Extremities: No lower extremity edema. No clubbing or deformities.  Neuro:  Alert and oriented x 4 , grossly normal neurologically.  Skin: Warm and dry, no rash or jaundice.   Psych: Alert and cooperative, normal mood and affect.

## 2014-05-10 ENCOUNTER — Other Ambulatory Visit (HOSPITAL_COMMUNITY): Payer: Self-pay | Admitting: Family Medicine

## 2014-05-10 ENCOUNTER — Ambulatory Visit (HOSPITAL_COMMUNITY)
Admission: RE | Admit: 2014-05-10 | Discharge: 2014-05-10 | Disposition: A | Discharge status: Home / Self Care | Payer: Medicare Other | Source: Ambulatory Visit | Attending: Family Medicine | Admitting: Family Medicine

## 2014-05-10 ENCOUNTER — Encounter (HOSPITAL_COMMUNITY): Payer: Self-pay | Admitting: Internal Medicine

## 2014-05-10 DIAGNOSIS — Z7722 Contact with and (suspected) exposure to environmental tobacco smoke (acute) (chronic): Secondary | ICD-10-CM | POA: Diagnosis not present

## 2014-05-10 DIAGNOSIS — R0989 Other specified symptoms and signs involving the circulatory and respiratory systems: Secondary | ICD-10-CM | POA: Insufficient documentation

## 2014-05-10 DIAGNOSIS — R05 Cough: Secondary | ICD-10-CM | POA: Diagnosis not present

## 2014-05-13 ENCOUNTER — Encounter: Payer: Self-pay | Admitting: Internal Medicine

## 2014-08-13 ENCOUNTER — Other Ambulatory Visit: Payer: Self-pay

## 2015-02-17 HISTORY — PX: BASAL CELL CARCINOMA EXCISION: SHX1214

## 2015-03-19 ENCOUNTER — Other Ambulatory Visit: Payer: Self-pay | Admitting: Dermatology

## 2015-06-03 ENCOUNTER — Ambulatory Visit (INDEPENDENT_AMBULATORY_CARE_PROVIDER_SITE_OTHER): Payer: Medicare Other | Admitting: Family Medicine

## 2015-06-03 ENCOUNTER — Encounter: Payer: Self-pay | Admitting: Family Medicine

## 2015-06-03 VITALS — BP 168/92 | HR 76 | Temp 98.6°F | Ht 68.0 in | Wt 192.0 lb

## 2015-06-03 DIAGNOSIS — IMO0001 Reserved for inherently not codable concepts without codable children: Secondary | ICD-10-CM | POA: Insufficient documentation

## 2015-06-03 DIAGNOSIS — E785 Hyperlipidemia, unspecified: Secondary | ICD-10-CM | POA: Diagnosis not present

## 2015-06-03 DIAGNOSIS — R030 Elevated blood-pressure reading, without diagnosis of hypertension: Secondary | ICD-10-CM | POA: Diagnosis not present

## 2015-06-03 DIAGNOSIS — Z86010 Personal history of colonic polyps: Secondary | ICD-10-CM | POA: Diagnosis not present

## 2015-06-03 NOTE — Patient Instructions (Signed)
DASH Eating Plan  DASH stands for "Dietary Approaches to Stop Hypertension." The DASH eating plan is a healthy eating plan that has been shown to reduce high blood pressure (hypertension). Additional health benefits may include reducing the risk of type 2 diabetes mellitus, heart disease, and stroke. The DASH eating plan may also help with weight loss.  WHAT DO I NEED TO KNOW ABOUT THE DASH EATING PLAN?  For the DASH eating plan, you will follow these general guidelines:  · Choose foods with a percent daily value for sodium of less than 5% (as listed on the food label).  · Use salt-free seasonings or herbs instead of table salt or sea salt.  · Check with your health care provider or pharmacist before using salt substitutes.  · Eat lower-sodium products, often labeled as "lower sodium" or "no salt added."  · Eat fresh foods.  · Eat more vegetables, fruits, and low-fat dairy products.  · Choose whole grains. Look for the word "whole" as the first word in the ingredient list.  · Choose fish and skinless chicken or turkey more often than red meat. Limit fish, poultry, and meat to 6 oz (170 g) each day.  · Limit sweets, desserts, sugars, and sugary drinks.  · Choose heart-healthy fats.  · Limit cheese to 1 oz (28 g) per day.  · Eat more home-cooked food and less restaurant, buffet, and fast food.  · Limit fried foods.  · Cook foods using methods other than frying.  · Limit canned vegetables. If you do use them, rinse them well to decrease the sodium.  · When eating at a restaurant, ask that your food be prepared with less salt, or no salt if possible.  WHAT FOODS CAN I EAT?  Seek help from a dietitian for individual calorie needs.  Grains  Whole grain or whole wheat bread. Brown rice. Whole grain or whole wheat pasta. Quinoa, bulgur, and whole grain cereals. Low-sodium cereals. Corn or whole wheat flour tortillas. Whole grain cornbread. Whole grain crackers. Low-sodium crackers.  Vegetables  Fresh or frozen vegetables  (raw, steamed, roasted, or grilled). Low-sodium or reduced-sodium tomato and vegetable juices. Low-sodium or reduced-sodium tomato sauce and paste. Low-sodium or reduced-sodium canned vegetables.   Fruits  All fresh, canned (in natural juice), or frozen fruits.  Meat and Other Protein Products  Ground beef (85% or leaner), grass-fed beef, or beef trimmed of fat. Skinless chicken or turkey. Ground chicken or turkey. Pork trimmed of fat. All fish and seafood. Eggs. Dried beans, peas, or lentils. Unsalted nuts and seeds. Unsalted canned beans.  Dairy  Low-fat dairy products, such as skim or 1% milk, 2% or reduced-fat cheeses, low-fat ricotta or cottage cheese, or plain low-fat yogurt. Low-sodium or reduced-sodium cheeses.  Fats and Oils  Tub margarines without trans fats. Light or reduced-fat mayonnaise and salad dressings (reduced sodium). Avocado. Safflower, olive, or canola oils. Natural peanut or almond butter.  Other  Unsalted popcorn and pretzels.  The items listed above may not be a complete list of recommended foods or beverages. Contact your dietitian for more options.  WHAT FOODS ARE NOT RECOMMENDED?  Grains  White bread. White pasta. White rice. Refined cornbread. Bagels and croissants. Crackers that contain trans fat.  Vegetables  Creamed or fried vegetables. Vegetables in a cheese sauce. Regular canned vegetables. Regular canned tomato sauce and paste. Regular tomato and vegetable juices.  Fruits  Dried fruits. Canned fruit in light or heavy syrup. Fruit juice.  Meat and Other Protein   Products  Fatty cuts of meat. Ribs, chicken wings, bacon, sausage, bologna, salami, chitterlings, fatback, hot dogs, bratwurst, and packaged luncheon meats. Salted nuts and seeds. Canned beans with salt.  Dairy  Whole or 2% milk, cream, half-and-half, and cream cheese. Whole-fat or sweetened yogurt. Full-fat cheeses or blue cheese. Nondairy creamers and whipped toppings. Processed cheese, cheese spreads, or cheese  curds.  Condiments  Onion and garlic salt, seasoned salt, table salt, and sea salt. Canned and packaged gravies. Worcestershire sauce. Tartar sauce. Barbecue sauce. Teriyaki sauce. Soy sauce, including reduced sodium. Steak sauce. Fish sauce. Oyster sauce. Cocktail sauce. Horseradish. Ketchup and mustard. Meat flavorings and tenderizers. Bouillon cubes. Hot sauce. Tabasco sauce. Marinades. Taco seasonings. Relishes.  Fats and Oils  Butter, stick margarine, lard, shortening, ghee, and bacon fat. Coconut, palm kernel, or palm oils. Regular salad dressings.  Other  Pickles and olives. Salted popcorn and pretzels.  The items listed above may not be a complete list of foods and beverages to avoid. Contact your dietitian for more information.  WHERE CAN I FIND MORE INFORMATION?  National Heart, Lung, and Blood Institute: www.nhlbi.nih.gov/health/health-topics/topics/dash/     This information is not intended to replace advice given to you by your health care provider. Make sure you discuss any questions you have with your health care provider.     Document Released: 01/22/2011 Document Revised: 02/23/2014 Document Reviewed: 12/07/2012  Elsevier Interactive Patient Education ©2016 Elsevier Inc.

## 2015-06-03 NOTE — Progress Notes (Signed)
Subjective:    Patient ID: Jose King, male    DOB: 1941-02-07, 75 y.o.   MRN: [id number]  HPI  Patient here to establish care.  He has history of hyperlipidemia treated with atorvastatin. He's had prior history of adenomatous colon polyps and positive family history of colon cancer. His colonoscopy is up-to-date. Never smoked. Takes cholesterol medication regularly. No myalgias. No history of CAD or peripheral vascular disease.   History of basal cell carcinoma with recent excision left side of nose. Healing well. Never diagnosed or treated for hypertension. No cardiac history.  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  Past Medical History  Diagnosis Date  . Hyperlipidemia   . Macular degeneration    Past Surgical History  Procedure Laterality Date  . Back surgery  1985  . Colonoscopy  05/10/2009    RMR: normal. repeat 5 years  . Colonoscopy  2003    RMR: normal  . Colonoscopy  1999    RMR: adenomatous ICV  . Colonoscopy N/A 05/09/2014    Procedure: COLONOSCOPY;  Surgeon: Daneil Dolin, MD;  Location: [facility];  Service: Endoscopy;  Laterality: N/A;  9:15am  . Inguinal hernia repair  1985  . Basal cell carcinoma excision  2017  . Tonsillectomy and adenoidectomy  1948  . Prostate surgery      reports that he has never smoked. He does not have any smokeless tobacco history on file. He reports that he does not drink alcohol or use illicit drugs. family history includes Cancer in his other and other; Cancer (age of onset: 65) in his mother; Cancer (age of onset: 56) in his brother; Heart attack (age of onset: 61) in his father; Lymphoma in his brother; Suicidality in his brother; Vision loss in his paternal uncle. No Known Allergies    Review of Systems  Constitutional: Negative for fatigue.  Eyes: Negative for visual disturbance.  Respiratory: Negative for cough, chest tightness and shortness of breath.   Cardiovascular: Negative for chest pain,  palpitations and leg swelling.  Gastrointestinal: Negative for abdominal pain.  Endocrine: Negative for polydipsia and polyuria.  Genitourinary: Negative for dysuria.  Neurological: Negative for dizziness, syncope, weakness, light-headedness and headaches.  Hematological: Negative for adenopathy.  Psychiatric/Behavioral: Negative for dysphoric mood.       Objective:   Physical Exam  Constitutional: He is oriented to person, place, and time. He appears well-developed and well-nourished.  HENT:  Right Ear: External ear normal.  Left Ear: External ear normal.  Mouth/Throat: Oropharynx is clear and moist.  Eyes: Pupils are equal, round, and reactive to light.  Neck: Neck supple. No thyromegaly present.  Cardiovascular: Normal rate and regular rhythm.   Pulmonary/Chest: Effort normal and breath sounds normal. No respiratory distress. He has no wheezes. He has no rales.  Musculoskeletal: He exhibits no edema.  Lymphadenopathy:    He has no cervical adenopathy.  Neurological: He is alert and oriented to person, place, and time.          Assessment & Plan:   #1 dyslipidemia. Patient reportedly had lipids over a year ago. Will schedule complete physical and obtain labs then   #2 elevated blood pressure. No prior history of reported hypertension. Repeat today right arm seated after rest 170/90 and left arm 180/98. We discussed initiating treatment but since he has never had a prior diagnosis we agreed to wait 1 week with physical and if still elevated at that time initiate medication- probably amlodipine. DASH diet  given.  #3 history of adenomatous colon polyps. Followed by GI

## 2015-06-03 NOTE — Progress Notes (Signed)
Pre visit review using our clinic review tool, if applicable. No additional management support is needed unless otherwise documented below in the visit note. 

## 2015-06-12 ENCOUNTER — Other Ambulatory Visit (INDEPENDENT_AMBULATORY_CARE_PROVIDER_SITE_OTHER): Payer: Medicare Other

## 2015-06-12 DIAGNOSIS — Z0000 Encounter for general adult medical examination without abnormal findings: Secondary | ICD-10-CM

## 2015-06-12 LAB — BASIC METABOLIC PANEL
BUN: 19 mg/dL (ref 6–23)
CHLORIDE: 105 meq/L (ref 96–112)
CO2: 32 mEq/L (ref 19–32)
Calcium: 9.1 mg/dL (ref 8.4–10.5)
Creatinine, Ser: 1.04 mg/dL (ref 0.40–1.50)
GFR: 74.06 mL/min (ref >60.00)
Glucose, Bld: 105 mg/dL — ABNORMAL HIGH (ref 70–99)
Potassium: 4.1 mEq/L (ref 3.5–5.1)
Sodium: 143 mEq/L (ref 135–145)

## 2015-06-12 LAB — CBC WITH DIFFERENTIAL/PLATELET
BASOS ABS: 0 10*3/uL (ref 0.0–0.1)
BASOS PCT: 0.5 % (ref 0.0–3.0)
Eosinophils Absolute: 0.1 10*3/uL (ref 0.0–0.7)
Eosinophils Relative: 1.6 % (ref 0.0–5.0)
HEMATOCRIT: 42.7 % (ref 39.0–52.0)
HEMOGLOBIN: 14.9 g/dL (ref 13.0–17.0)
LYMPHS PCT: 38 % (ref 12.0–46.0)
Lymphs Abs: 1.3 10*3/uL (ref 0.7–4.0)
MCHC: 34.9 g/dL (ref 30.0–36.0)
MCV: 86.8 fl (ref 78.0–100.0)
MONO ABS: 0.4 10*3/uL (ref 0.1–1.0)
Monocytes Relative: 10.9 % (ref 3.0–12.0)
Neutro Abs: 1.7 10*3/uL (ref 1.4–7.7)
Neutrophils Relative %: 49 % (ref 43.0–77.0)
Platelets: 115 10*3/uL — ABNORMAL LOW (ref 150.0–400.0)
RBC: 4.92 Mil/uL (ref 4.22–5.81)
RDW: 14 % (ref 11.5–15.5)
WBC: 3.4 10*3/uL — AB (ref 4.0–10.5)

## 2015-06-12 LAB — LIPID PANEL
CHOLESTEROL: 141 mg/dL (ref 0–200)
HDL: 37.9 mg/dL — ABNORMAL LOW (ref >39.00)
LDL Cholesterol: 77 mg/dL (ref 0–99)
NonHDL: 102.92
Total CHOL/HDL Ratio: 4
Triglycerides: 130 mg/dL (ref 0.0–149.0)
VLDL: 26 mg/dL (ref 0.0–40.0)

## 2015-06-12 LAB — HEPATIC FUNCTION PANEL
ALK PHOS: 110 U/L (ref 39–117)
ALT: 28 U/L (ref 0–53)
AST: 21 U/L (ref 0–37)
Albumin: 4.2 g/dL (ref 3.5–5.2)
BILIRUBIN DIRECT: 0.1 mg/dL (ref 0.0–0.3)
BILIRUBIN TOTAL: 0.6 mg/dL (ref 0.2–1.2)
Total Protein: 6.5 g/dL (ref 6.0–8.3)

## 2015-06-12 LAB — PSA: PSA: 2.79 ng/mL (ref 0.10–4.00)

## 2015-06-12 LAB — TSH: TSH: 1.93 u[IU]/mL (ref 0.35–4.50)

## 2015-06-19 ENCOUNTER — Encounter: Payer: Self-pay | Admitting: Family Medicine

## 2015-06-19 ENCOUNTER — Ambulatory Visit (INDEPENDENT_AMBULATORY_CARE_PROVIDER_SITE_OTHER): Payer: Medicare Other | Admitting: Family Medicine

## 2015-06-19 VITALS — BP 160/90 | HR 87 | Temp 98.0°F | Ht 68.0 in | Wt 193.5 lb

## 2015-06-19 DIAGNOSIS — Z0000 Encounter for general adult medical examination without abnormal findings: Secondary | ICD-10-CM | POA: Diagnosis not present

## 2015-06-19 DIAGNOSIS — Z23 Encounter for immunization: Secondary | ICD-10-CM

## 2015-06-19 MED ORDER — AMLODIPINE BESYLATE 5 MG PO TABS: 5.0000 mg | ORAL_TABLET | Freq: Every day | ORAL | Status: DC

## 2015-06-19 NOTE — Addendum Note (Signed)
Addended by: Elio Forget on: 06/19/2015 08:47 AM   Modules accepted: Orders, SmartSet

## 2015-06-19 NOTE — Progress Notes (Signed)
Subjective:    Patient ID: Jose King, male    DOB: 1940/03/17, 75 y.o.   MRN: [id number]  HPI Patient is here for physical exam. He has history of hyperlipidemia, adenomatous colon polyps, and elevated blood pressure last visit. Never treated for hypertension. Takes aspirin and Lipitor. No history of CAD. Never smoked. No history of recent headaches or chest pains.  He states he's had prior shingles vaccine. He states he has also had both pneumonia vaccines recently. Last tetanus is unknown  Past Medical History  Diagnosis Date  . Hyperlipidemia   . Macular degeneration    Past Surgical History  Procedure Laterality Date  . Back surgery  1985  . Colonoscopy  05/10/2009    RMR: normal. repeat 5 years  . Colonoscopy  2003    RMR: normal  . Colonoscopy  1999    RMR: adenomatous ICV  . Colonoscopy N/A 05/09/2014    Procedure: COLONOSCOPY;  Surgeon: Daneil Dolin, MD;  [facility];  Service: Endoscopy;  Laterality: N/A;  9:15am  . Inguinal hernia repair  1985  . Basal cell carcinoma excision  2017  . Tonsillectomy and adenoidectomy  1948  . Prostate surgery      reports that he has never smoked. He does not have any smokeless tobacco history on file. He reports that he does not drink alcohol or use illicit drugs. family history includes Cancer in his other and other; Cancer (age of onset: 6) in his mother; Cancer (age of onset: 85) in his brother; Heart attack (age of onset: 74) in his father; Lymphoma in his brother; Suicidality in his brother; Vision loss in his paternal uncle. No Known Allergies     Review of Systems  Constitutional: Negative for fever, activity change, appetite change and fatigue.  HENT: Negative for congestion, ear pain and trouble swallowing.   Eyes: Negative for pain and visual disturbance.  Respiratory: Negative for cough, shortness of breath and wheezing.   Cardiovascular: Negative for chest pain and palpitations.    Gastrointestinal: Negative for nausea, vomiting, abdominal pain, diarrhea, constipation, blood in stool, abdominal distention and rectal pain.  Endocrine: Negative for polydipsia and polyuria.  Genitourinary: Negative for dysuria, hematuria and testicular pain.  Musculoskeletal: Negative for joint swelling and arthralgias.  Skin: Negative for rash.  Neurological: Negative for dizziness, syncope and headaches.  Hematological: Negative for adenopathy.  Psychiatric/Behavioral: Negative for confusion and dysphoric mood.       Objective:   Physical Exam  Constitutional: He is oriented to person, place, and time. He appears well-developed and well-nourished. No distress.  HENT:  Head: Normocephalic and atraumatic.  Right Ear: External ear normal.  Left Ear: External ear normal.  Mouth/Throat: Oropharynx is clear and moist.  Eyes: Conjunctivae and EOM are normal. Pupils are equal, round, and reactive to light.  Neck: Normal range of motion. Neck supple. No thyromegaly present.  Cardiovascular: Normal rate, regular rhythm and normal heart sounds.   No murmur heard. Pulmonary/Chest: No respiratory distress. He has no wheezes. He has no rales.  Abdominal: Soft. Bowel sounds are normal. He exhibits no distension and no mass. There is no tenderness. There is no rebound and no guarding.  Musculoskeletal: He exhibits no edema.  Lymphadenopathy:    He has no cervical adenopathy.  Neurological: He is alert and oriented to person, place, and time. He displays normal reflexes. No cranial nerve deficit.  Skin: No rash noted.  Scattered seborrheic keratoses but no concerning lesions  Psychiatric: He has a normal mood and affect.          Assessment & Plan:  #1 physical exam. Tetanus booster given. Labs reviewed. Minimally elevated glucose 105. Platelets 115,000. No previous comparisons. Other immunizations up-to-date. Colonoscopy up-to-date. Maintain active lifestyle  #2 hypertension.  Currently untreated. Confirmed on several readings and also elevated by several readings at home consistently around 0000000 systolic. Start amlodipine 5 mg once daily. Reassess one month  Eulas Post MD [facility] at [facility]

## 2015-06-19 NOTE — Progress Notes (Signed)
Pre visit review using our clinic review tool, if applicable. No additional management support is needed unless otherwise documented below in the visit note. 

## 2015-07-22 ENCOUNTER — Encounter: Payer: Self-pay | Admitting: Family Medicine

## 2015-07-22 ENCOUNTER — Ambulatory Visit (INDEPENDENT_AMBULATORY_CARE_PROVIDER_SITE_OTHER): Payer: Medicare Other | Admitting: Family Medicine

## 2015-07-22 VITALS — BP 138/78 | HR 79 | Temp 97.4°F | Ht 68.0 in | Wt 191.0 lb

## 2015-07-22 DIAGNOSIS — I10 Essential (primary) hypertension: Secondary | ICD-10-CM | POA: Diagnosis not present

## 2015-07-22 NOTE — Progress Notes (Signed)
Pre visit review using our clinic review tool, if applicable. No additional management support is needed unless otherwise documented below in the visit note. 

## 2015-07-22 NOTE — Progress Notes (Signed)
   Subjective:    Patient ID: Jose King, male    DOB: Apr 03, 1940, 75 y.o.   MRN: [id number]  HPI Follow-up hypertension. He was seen for physical recently and had several readings up over 0000000 systolic. We started amlodipine 5 mg daily. Tolerating without side effects. Compliant with therapy. Blood pressures at home but mostly [id number] systolic and diastolics mostly [id number] and [id number]. Still has some fatigue issues. Denies any dyspnea or chest pains. Recent lab work unremarkable.  Past Medical History  Diagnosis Date  . Hyperlipidemia   . Macular degeneration    Past Surgical History  Procedure Laterality Date  . Back surgery  1985  . Colonoscopy  05/10/2009    RMR: normal. repeat 5 years  . Colonoscopy  2003    RMR: normal  . Colonoscopy  1999    RMR: adenomatous ICV  . Colonoscopy N/A 05/09/2014    Procedure: COLONOSCOPY;  Surgeon: Daneil Dolin, MD;  [facility];  Service: Endoscopy;  Laterality: N/A;  9:15am  . Inguinal hernia repair  1985  . Basal cell carcinoma excision  2017  . Tonsillectomy and adenoidectomy  1948  . Prostate surgery      reports that he has never smoked. He does not have any smokeless tobacco history on file. He reports that he does not drink alcohol or use illicit drugs. family history includes Cancer in his other and other; Cancer (age of onset: 34) in his mother; Cancer (age of onset: 82) in his brother; Heart attack (age of onset: 26) in his father; Lymphoma in his brother; Suicidality in his brother; Vision loss in his paternal uncle. No Known Allergies     Review of Systems  Constitutional: Positive for fatigue.  Eyes: Negative for visual disturbance.  Respiratory: Negative for cough, chest tightness and shortness of breath.   Cardiovascular: Negative for chest pain, palpitations and leg swelling.  Neurological: Negative for dizziness, syncope, weakness, light-headedness and headaches.       Objective:   Physical Exam    Constitutional: He is oriented to person, place, and time. He appears well-developed and well-nourished.  HENT:  Right Ear: External ear normal.  Left Ear: External ear normal.  Mouth/Throat: Oropharynx is clear and moist.  Eyes: Pupils are equal, round, and reactive to light.  Neck: Neck supple. No thyromegaly present.  Cardiovascular: Normal rate and regular rhythm.   Pulmonary/Chest: Effort normal and breath sounds normal. No respiratory distress. He has no wheezes. He has no rales.  Musculoskeletal: He exhibits no edema.  Neurological: He is alert and oriented to person, place, and time.          Assessment & Plan:  Hypertension. Improved. Continue amlodipine 5 mg daily. Continue monitoring at home and be in touch if consistently greater than 150/90. We'll plan routine follow-up for physical next spring unless any concerns before then  Eulas Post MD [facility] at [facility]

## 2015-07-22 NOTE — Patient Instructions (Signed)
Monitor blood pressure and be in touch if consistently > 150/90 

## 2016-01-20 ENCOUNTER — Telehealth: Payer: Self-pay | Admitting: Family Medicine

## 2016-01-20 MED ORDER — ATORVASTATIN CALCIUM 20 MG PO TABS: 20.0000 mg | ORAL_TABLET | Freq: Every day | ORAL | 3 refills | Status: DC

## 2016-01-20 NOTE — Telephone Encounter (Signed)
Pt request refill  atorvastatin (LIPITOR) 20 MG tablet  90 day  [facility]/ [address], [place]

## 2016-01-20 NOTE — Telephone Encounter (Signed)
Medication sent in for patient. 

## 2016-01-28 ENCOUNTER — Other Ambulatory Visit: Payer: Self-pay | Admitting: Dermatology

## 2016-05-28 ENCOUNTER — Telehealth: Payer: Self-pay

## 2016-05-28 NOTE — Telephone Encounter (Signed)
Call to Mr. Bendorf regarding AWV   LVM for call back, seen wife on 4/10  Stated he had not had a wellness

## 2016-05-31 ENCOUNTER — Other Ambulatory Visit: Payer: Self-pay | Admitting: Family Medicine

## 2016-06-18 ENCOUNTER — Encounter: Payer: Self-pay | Admitting: Family Medicine

## 2016-06-18 NOTE — Progress Notes (Addendum)
Subjective:   Jose King is a 76 y.o. male who presents for Medicare Annual (Subsequent) preventive examination.  The Patient was informed that the wellness visit is to identify future health risk and educate and initiate measures that can reduce risk for increased disease through the lifespan.    NO ROS; Medicare Wellness Visit Macular Degeneration  Describes health as good, fair or great? 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 No   RISK FACTORS Diet 3 meals a day  With snack Breakfast varies pancake, oatmeal; differs  Lunch peanut butter depends on where he is out Dinner; full course meal  Does a variety; likes vegetables more than meat  Loves vegetables  Regular exercise  Over works [employer]; yard; garden Up and going every day   Cardiac Risk Factors:  Advanced aged > 59 in men; >65 in women Hyperlipidemia - chol; 141; HDL 37; LDL 77 and trig 130  Diabetes neg; fbs 105 Family History - cancer and MI; Mother died at 41 (intestinal tumor)  and brother at 36 with cancer and father died of MI at 26  Obesity-BMI 91   Fall risk  Given education on "Fall Prevention in the Home" for more safety tips the patient can apply as appropriate.  Long term goal is to "age in place" or undecided   Mobility of Functional changes this year? no Overall soreness  Safety; community,  wears sunscreen, dr. Denna Haggard-   basal cell  carcinoma hx safe place for firearms; in a safe place Motor vehicle accidents; no  Mental Health:  Any emotional problems? Anxious, depressed, irritable, sad or blue? no Denies feeling depressed or hopeless; voices pleasure in daily life How many social activities have you been engaged in within the last 2 weeks? no   [facility] exam at [facility]; Dr. Manuella Gh[facility]; Dr. Daron Offer   Activities of Daily Living - See functional screen   Cognitive testing; Ad8 score; 0 or less than 2  MMSE deferred or completed if AD8 + 2 issues  Advanced Directives given copy of cone for comparison  Patient Care Team: Eulas Post, MD as PCP - General (Family Medicine) Daneil Dolin, MD as Consulting Physician (Gastroenterology) Dermatology with hx of 2 skin cancers;   Immunization History  Administered Date(s) Administered  . Tdap 06/19/2015   Required Immunizations needed today  Screening test up to date or reviewed for plan of completion Health Maintenance Due  Topic Date Due  . PNA vac Low Risk Adult (1 of 2 - PCV13) 10/04/2005   Has had both pneumonia vaccines at the [facility] practice   Cardiac Risk Factors include: advanced age (>51men, >71 women);dyslipidemia;hypertension;family history of premature cardiovascular disease;male gender     Objective:     Vitals: BP (!) 146/70   Pulse 68   Ht 5\' 10"  (1.778 m)   Wt 186 lb (84.4 kg)  SpO2 97%   BMI 26.69 kg/m   Body mass index is 26.69 kg/m.   Tobacco History  Smoking Status  . Never Smoker  Smokeless Tobacco  . Never Used     Counseling given: Yes   Past Medical History:  Diagnosis Date  . Hyperlipidemia   . Macular degeneration    Past Surgical History:  Procedure Laterality Date  . BACK SURGERY  1985  . BASAL CELL CARCINOMA EXCISION  2017  . COLONOSCOPY  05/10/2009   RMR: normal. repeat 5 years  . COLONOSCOPY  2003   RMR: normal  . COLONOSCOPY  1999   RMR: adenomatous ICV  . COLONOSCOPY N/A 05/09/2014    Procedure: COLONOSCOPY;  Surgeon: Daneil Dolin, MD;  [facility];  Service: Endoscopy;  Laterality: N/A;  9:15am  . [place]  . PROSTATE SURGERY    . TONSILLECTOMY AND ADENOIDECTOMY  1948   Family History  Problem Relation Age of Onset  . Cancer Mother 77    colon  . Heart attack Father 86  . Suicidality Brother   . Cancer Brother 95    Pancreatic  . Lymphoma Brother   . Cancer Other     grandfather, mouth  . Cancer Other     grandmother, intestional   . Vision loss Paternal Uncle     Massive Stroke   History  Sexual Activity  . Sexual activity: Not on file    Outpatient Encounter Prescriptions as of 06/19/2016  Medication Sig  . amLODipine (NORVASC) 5 MG tablet take 1 tablet once daily  . aspirin 81 MG tablet Take 81 mg by mouth daily.    Marland Kitchen atorvastatin (LIPITOR) 20 MG tablet Take 1 tablet (20 mg total) by mouth daily.  . calcium-vitamin D (OSCAL) 250-125 MG-UNIT per tablet Take 1 tablet by mouth 3 (three) times a week.   . Multiple Vitamins-Minerals (OCUVITE PRESERVISION) TABS Take 1 tablet by mouth 2 (two) times daily.    No facility-administered encounter medications on file as of 06/19/2016.     Activities of Daily Living In your present state of health, do you have any difficulty performing the following activities: 06/19/2016  Hearing? Y  Vision? N  Difficulty concentrating or making decisions? N  Walking or climbing stairs? N  Dressing or bathing? N  Doing errands, shopping? N  Preparing Food and eating ? N  Using the Toilet? N  In the past six months, have you accidently leaked urine? N  Do you have problems with loss of bowel control? N  Managing your Medications? N  Managing your Finances? N  Housekeeping or managing your Housekeeping? N  Some recent data might be hidden    Patient Care Team: Eulas Post, MD as PCP - General (Family Medicine) Daneil Dolin, MD as Consulting Physician (Gastroenterology)      Assessment:     Exercise Activities and Dietary recommendations Current Exercise Habits: Home exercise routine, Type of exercise: walking;strength training/weights, Time (Minutes): 60, Frequency (Times/Week): 4, Weekly Exercise (Minutes/Week): 240, Intensity: Moderate  Goals    . Reduce sugar intake to X grams per day          Cut back on sweets and eat more        Fall Risk Fall Risk  06/19/2016 06/19/2015  Falls in the past year? No No   Depression Screen PHQ 2/9 Scores 06/19/2016 06/19/2015  PHQ - 2 Score 0 0     Cognitive Function  MMSE - Mini Mental State Exam 06/19/2016  Not completed: (No Data)     6CIT Screen 06/19/2016  What Year? 0 points  What month? 0 points  What time? 0 points  Count back from 20 0 points  Months in reverse 0 points  Repeat phrase 0 points  Total Score 0    Immunization History  Administered Date(s) Administered  . Tdap 06/19/2015   Screening Tests Health Maintenance  Topic Date Due  . PNA vac Low Risk Adult (1 of 2 - PCV13) 10/04/2005  . INFLUENZA VACCINE  09/16/2016  . COLONOSCOPY  05/08/2024  . TETANUS/TDAP  06/18/2025      Plan:    PCP Notes  Health Maintenance Had pneumonia series at prior practice; Awaiting records   Abnormal Screens  none  Referrals   Patient concerns; joints in hands deferred to dr. Elease Hashimoto  Nurse Concerns; none  Next PCP apt today    I have personally reviewed and noted the following in the patient's chart:   . Medical and social history . Use of alcohol, tobacco or illicit drugs  . Current medications and supplements . Functional ability and status . Nutritional status . Physical activity . Advanced directives . List of other physicians . Hospitalizations, surgeries, and ER visits in previous 12 months . Vitals . Screenings to include cognitive, depression, and falls . Referrals and appointments  In addition, I have reviewed and discussed with patient certain preventive protocols,  quality metrics, and best practice recommendations. A written personalized care plan for preventive services as well as general preventive health recommendations were provided to patient.     KGURK,YHCWC, RN  06/19/2016  Agree with assessment as above.  Eulas Post MD [facility] at [facility]

## 2016-06-19 ENCOUNTER — Ambulatory Visit (INDEPENDENT_AMBULATORY_CARE_PROVIDER_SITE_OTHER): Payer: Medicare Other | Admitting: Family Medicine

## 2016-06-19 ENCOUNTER — Encounter: Payer: Self-pay | Admitting: Family Medicine

## 2016-06-19 VITALS — BP 140/80 | HR 68 | Ht 70.0 in | Wt 186.0 lb

## 2016-06-19 DIAGNOSIS — Z0000 Encounter for general adult medical examination without abnormal findings: Secondary | ICD-10-CM | POA: Diagnosis not present

## 2016-06-19 LAB — CBC WITH DIFFERENTIAL/PLATELET
BASOS ABS: 0 10*3/uL (ref 0.0–0.1)
Basophils Relative: 0.6 % (ref 0.0–3.0)
EOS ABS: 0.1 10*3/uL (ref 0.0–0.7)
Eosinophils Relative: 2.3 % (ref 0.0–5.0)
HCT: 43.4 % (ref 39.0–52.0)
Hemoglobin: 14.8 g/dL (ref 13.0–17.0)
Lymphocytes Relative: 36.4 % (ref 12.0–46.0)
Lymphs Abs: 1.3 10*3/uL (ref 0.7–4.0)
MCHC: 34 g/dL (ref 30.0–36.0)
MCV: 89 fl (ref 78.0–100.0)
MONO ABS: 0.4 10*3/uL (ref 0.1–1.0)
Monocytes Relative: 10.2 % (ref 3.0–12.0)
Neutro Abs: 1.7 10*3/uL (ref 1.4–7.7)
Neutrophils Relative %: 50.5 % (ref 43.0–77.0)
Platelets: 125 10*3/uL — ABNORMAL LOW (ref 150.0–400.0)
RBC: 4.88 Mil/uL (ref 4.22–5.81)
RDW: 13.4 % (ref 11.5–15.5)
WBC: 3.4 10*3/uL — AB (ref 4.0–10.5)

## 2016-06-19 LAB — LIPID PANEL
CHOL/HDL RATIO: 3
Cholesterol: 145 mg/dL (ref 0–200)
HDL: 46.8 mg/dL (ref >39.00)
LDL Cholesterol: 70 mg/dL (ref 0–99)
NONHDL: 98.07
Triglycerides: 139 mg/dL (ref 0.0–149.0)
VLDL: 27.8 mg/dL (ref 0.0–40.0)

## 2016-06-19 LAB — BASIC METABOLIC PANEL
BUN: 19 mg/dL (ref 6–23)
CO2: 28 mEq/L (ref 19–32)
Calcium: 9.1 mg/dL (ref 8.4–10.5)
Chloride: 108 mEq/L (ref 96–112)
Creatinine, Ser: 1.03 mg/dL (ref 0.40–1.50)
GFR: 74.69 mL/min (ref >60.00)
Glucose, Bld: 97 mg/dL (ref 70–99)
Potassium: 4.1 mEq/L (ref 3.5–5.1)
Sodium: 142 mEq/L (ref 135–145)

## 2016-06-19 LAB — HEPATIC FUNCTION PANEL
ALT: 16 U/L (ref 0–53)
AST: 15 U/L (ref 0–37)
Albumin: 4.4 g/dL (ref 3.5–5.2)
Alkaline Phosphatase: 103 U/L (ref 39–117)
BILIRUBIN TOTAL: 0.7 mg/dL (ref 0.2–1.2)
Bilirubin, Direct: 0.1 mg/dL (ref 0.0–0.3)
Total Protein: 6.4 g/dL (ref 6.0–8.3)

## 2016-06-19 LAB — TSH: TSH: 2.6 u[IU]/mL (ref 0.35–4.50)

## 2016-06-19 NOTE — Progress Notes (Signed)
Subjective:     Patient ID: Jose King, male   DOB: 03/25/1940, 76 y.o.   MRN: [id number]  HPI Patient here today for Medicare wellness exam and physical exam. His past medical history is negative for hypertension and hyperlipidemia. He is followed by dermatologist regularly. His medications include amlodipine, atorvastatin, and aspirin. Nonsmoker.  Stays very active. Had some occasional problems with left ring finger trigger finger. Not very bothersome. He is not interested in any current intervention this time. He has no specific complaints at this time. Immunizations are up-to-date. He's had both Prevnar 13 and Pneumovax previously through another practice. Colonoscopy up-to-date. He gets yearly flu vaccine.  Past Medical History:  Diagnosis Date  . Hyperlipidemia   . Macular degeneration    Past Surgical History:  Procedure Laterality Date  . BACK SURGERY  1985  . BASAL CELL CARCINOMA EXCISION  2017  . COLONOSCOPY  05/10/2009   RMR: normal. repeat 5 years  . COLONOSCOPY  2003   RMR: normal  . COLONOSCOPY  1999   RMR: adenomatous ICV  . COLONOSCOPY N/A 05/09/2014   Procedure: COLONOSCOPY;  Surgeon: Daneil Dolin, MD;  [facility];  Service: Endoscopy;  Laterality: N/A;  9:15am  . [place]  . PROSTATE SURGERY    . [place]    reports that he has never smoked. He has never used smokeless tobacco. He reports that he does not drink alcohol or use drugs. family history includes Cancer in his other and other; Cancer (age of onset: 35) in his mother; Cancer (age of onset: 46) in his brother; Heart attack (age of onset: 26) in his father; Lymphoma in his brother; Suicidality in his brother; Vision loss in his paternal uncle. No Known Allergies    Review of Systems  Constitutional: Negative for activity change, appetite change, fatigue and fever.  HENT: Negative for congestion, ear pain and trouble swallowing.   Eyes:  Negative for pain and visual disturbance.  Respiratory: Negative for cough, shortness of breath and wheezing.   Cardiovascular: Negative for chest pain and palpitations.  Gastrointestinal: Negative for abdominal distention, abdominal pain, blood in stool, constipation, diarrhea, nausea, rectal pain and vomiting.  Genitourinary: Negative for dysuria, hematuria and testicular pain.  Musculoskeletal: Negative for arthralgias and joint swelling.  Skin: Negative for rash.  Neurological: Negative for dizziness, syncope and headaches.  Hematological: Negative for adenopathy.  Psychiatric/Behavioral: Negative for confusion and dysphoric mood.       Objective:   Physical Exam  Constitutional: He is oriented to person, place, and time. He appears well-developed and well-nourished. No distress.  HENT:  Head: Normocephalic and atraumatic.  Right Ear: External ear normal.  Left Ear: External ear normal.  Mouth/Throat: Oropharynx is clear and moist.  Eyes: Conjunctivae and EOM are normal. Pupils are equal, round, and reactive to light.  Neck: Normal range of motion. Neck supple. No thyromegaly present.  Cardiovascular: Normal rate, regular rhythm and normal heart sounds.   No murmur heard. Pulmonary/Chest: No respiratory distress. He has no wheezes. He has no rales.  Abdominal: Soft. Bowel sounds are normal. He exhibits no distension and no mass. There is no tenderness. There is no rebound and no guarding.  Musculoskeletal: He exhibits no edema.  He has some changes involving DIP joints of several fingers consistent with osteoarthritis with Heberden's nodules  Lymphadenopathy:    He has no cervical adenopathy.  Neurological: He is alert and oriented to person, place,  and time. He displays normal reflexes. No cranial nerve deficit.  Skin: No rash noted.  Psychiatric: He has a normal mood and affect.       Assessment:     Physical exam. Patient requesting prescription for new shingles vaccine.  Other immunizations up-to-date    Plan:     -Obtain screening lab work. We discussed pros and cons of PSA screening at this point he declines. -Prescription for new shingles vaccine given -Monitor blood pressure be in touch if consistently greater than 140/90 -Continue with yearly flu vaccine  Eulas Post MD [facility] at [facility]

## 2016-06-19 NOTE — Patient Instructions (Addendum)
[name] , Thank you for taking time to come for your Medicare Wellness Visit. I appreciate your ongoing commitment to your health goals. Please review the following plan we discussed and let me know if I can assist you in the future.   We are still awaiting records with dates of Pneumonia series  Shingrix is a vaccine for the prevention of Shingles in Adults 19 and older.  If you are on Medicare, you can request a prescription from your doctor to be filled at a pharmacy.  Please check with your benefits regarding applicable copays or out of pocket expenses.  The Shingrix is given in 2 vaccines approx 8 weeks apart. You must receive the 2nd dose prior to 6 months from receipt of the first.     These are the goals we discussed: Goals    . Reduce sugar intake to X grams per day          Cut back on sweets and eat more         This is a list of the screening recommended for you and due dates:  Health Maintenance  Topic Date Due  . Pneumonia vaccines (1 of 2 - PCV13) 10/04/2005  . Flu Shot  09/16/2016  . Colon Cancer Screening  05/08/2024  . Tetanus Vaccine  06/18/2025    Health Maintenance, Male A healthy lifestyle and preventive care is important for your health and wellness. Ask your health care provider about what schedule of regular examinations is right for you. What should I know about weight and diet?  Eat a Healthy Diet  Eat plenty of vegetables, fruits, whole grains, low-fat dairy products, and lean protein.  Do not eat a lot of foods high in solid fats, added sugars, or salt. Maintain a Healthy Weight  Regular exercise can help you achieve or maintain a healthy weight. You should:  Do at least 150 minutes of exercise each week. The exercise should increase your heart rate and make you sweat (moderate-intensity exercise).  Do strength-training exercises at least twice a week. Watch Your Levels of Cholesterol and Blood Lipids  Have your blood tested  for lipids and cholesterol every 5 years starting at 76 years of age. If you are at high risk for heart disease, you should start having your blood tested when you are 76 years old. You may need to have your cholesterol levels checked more often if:  Your lipid or cholesterol levels are high.  You are older than 76 years of age.  You are at high risk for heart disease. What should I know about cancer screening? Many types of cancers can be detected early and may often be prevented. Lung Cancer  You should be screened every year for lung cancer if:  You are a current smoker who has smoked for at least 30 years.  You are a former smoker who has quit within the past 15 years.  Talk to your health care provider about your screening options, when you should start screening, and how often you should be screened. Colorectal Cancer  Routine colorectal cancer screening usually begins at 76 years of age and should be repeated every 5-10 years until you are 76 years old. You may need to be screened more often if early forms of precancerous polyps or small growths are found. Your health care provider may recommend screening at an earlier age if you have risk factors for colon cancer.  Your health care provider  may recommend using home test kits to check for hidden blood in the stool.  A small camera at the end of a tube can be used to examine your colon (sigmoidoscopy or colonoscopy). This checks for the earliest forms of colorectal cancer. Prostate and Testicular Cancer  Depending on your age and overall health, your health care provider may do certain tests to screen for prostate and testicular cancer.  Talk to your health care provider about any symptoms or concerns you have about testicular or prostate cancer. Skin Cancer  Check your skin from head to toe regularly.  Tell your health care provider about any new moles or changes in moles, especially if:  There is a change in a mole's size,  shape, or color.  You have a mole that is larger than a pencil eraser.  Always use sunscreen. Apply sunscreen liberally and repeat throughout the day.  Protect yourself by wearing long sleeves, pants, a wide-brimmed hat, and sunglasses when outside. What should I know about heart disease, diabetes, and high blood pressure?  If you are 56-7 years of age, have your blood pressure checked every 3-5 years. If you are 18 years of age or older, have your blood pressure checked every year. You should have your blood pressure measured twice-once when you are at a hospital or clinic, and once when you are not at a hospital or clinic. Record the average of the two measurements. To check your blood pressure when you are not at a hospital or clinic, you can use:  An automated blood pressure machine at a pharmacy.  A home blood pressure monitor.  Talk to your health care provider about your target blood pressure.  If you are between 81-69 years old, ask your health care provider if you should take aspirin to prevent heart disease.  Have regular diabetes screenings by checking your fasting blood sugar level.  If you are at a normal weight and have a low risk for diabetes, have this test once every three years after the age of 77.  If you are overweight and have a high risk for diabetes, consider being tested at a younger age or more often.  A one-time screening for abdominal aortic aneurysm (AAA) by ultrasound is recommended for men aged 84-75 years who are current or former smokers. What should I know about preventing infection? Hepatitis B  If you have a higher risk for hepatitis B, you should be screened for this virus. Talk with your health care provider to find out if you are at risk for hepatitis B infection. Hepatitis C  Blood testing is recommended for:  Everyone born from [birth year] through 1965.  Anyone with known risk factors for hepatitis C. Sexually Transmitted Diseases (STDs)  You  should be screened each year for STDs including gonorrhea and chlamydia if:  You are sexually active and are younger than 76 years of age.  You are older than 76 years of age and your health care provider tells you that you are at risk for this type of infection.  Your sexual activity has changed since you were last screened and you are at an increased risk for chlamydia or gonorrhea. Ask your health care provider if you are at risk.  Talk with your health care provider about whether you are at high risk of being infected with HIV. Your health care provider may recommend a prescription medicine to help prevent HIV infection. What else can I do?  Schedule regular health, dental, and eye  exams.  Stay current with your vaccines (immunizations).  Do not use any tobacco products, such as cigarettes, chewing tobacco, and e-cigarettes. If you need help quitting, ask your health care provider.  Limit alcohol intake to no more than 2 drinks per day. One drink equals 12 ounces of beer, 5 ounces of wine, or 1 ounces of hard liquor.  Do not use street drugs.  Do not share needles.  Ask your health care provider for help if you need support or information about quitting drugs.  Tell your health care provider if you often feel depressed.  Tell your health care provider if you have ever been abused or do not feel safe at home. This information is not intended to replace advice given to you by your health care provider. Make sure you discuss any questions you have with your health care provider. Document Released: 08/01/2007 Document Revised: 10/02/2015 Document Reviewed: 11/06/2014 Elsevier Interactive Patient Education  2017 [place] Prevention in the Home Falls can cause injuries and can affect people from all age groups. There are many simple things that you can do to make your home safe and to help prevent falls. What can I do on the outside of my home?  Regularly repair  the edges of walkways and driveways and fix any cracks.  Remove high doorway thresholds.  Trim any shrubbery on the main path into your home.  Use bright outdoor lighting.  Clear walkways of debris and clutter, including tools and rocks.  Regularly check that handrails are securely fastened and in good repair. Both sides of any steps should have handrails.  Install guardrails along the edges of any raised decks or porches.  Have leaves, snow, and ice cleared regularly.  Use sand or salt on walkways during winter months.  In the garage, clean up any spills right away, including grease or oil spills. What can I do in the bathroom?  Use night lights.  Install grab bars by the toilet and in the tub and shower. Do not use towel bars as grab bars.  Use non-skid mats or decals on the floor of the tub or shower.  If you need to sit down while you are in the shower, use a plastic, non-slip stool.  Keep the floor dry. Immediately clean up any water that spills on the floor.  Remove soap buildup in the tub or shower on a regular basis.  Attach bath mats securely with double-sided non-slip rug tape.  Remove throw rugs and other tripping hazards from the floor. What can I do in the bedroom?  Use night lights.  Make sure that a bedside light is easy to reach.  Do not use oversized bedding that drapes onto the floor.  Have a firm chair that has side arms to use for getting dressed.  Remove throw rugs and other tripping hazards from the floor. What can I do in the kitchen?  Clean up any spills right away.  Avoid walking on wet floors.  Place frequently used items in easy-to-reach places.  If you need to reach for something above you, use a sturdy step stool that has a grab bar.  Keep electrical cables out of the way.  Do not use floor polish or wax that makes floors slippery. If you have to use wax, make sure that it is non-skid floor wax.  Remove throw rugs and other  tripping hazards from the floor. What can I do in the stairways?  Do  not leave any items on the stairs.  Make sure that there are handrails on both sides of the stairs. Fix handrails that are broken or loose. Make sure that handrails are as long as the stairways.  Check any carpeting to make sure that it is firmly attached to the stairs. Fix any carpet that is loose or worn.  Avoid having throw rugs at the top or bottom of stairways, or secure the rugs with carpet tape to prevent them from moving.  Make sure that you have a light switch at the top of the stairs and the bottom of the stairs. If you do not have them, have them installed. What are some other fall prevention tips?  Wear closed-toe shoes that fit well and support your feet. Wear shoes that have rubber soles or low heels.  When you use a stepladder, make sure that it is completely opened and that the sides are firmly locked. Have someone hold the ladder while you are using it. Do not climb a closed stepladder.  Add color or contrast paint or tape to grab bars and handrails in your home. Place contrasting color strips on the first and last steps.  Use mobility aids as needed, such as canes, walkers, scooters, and crutches.  Turn on lights if it is dark. Replace any light bulbs that burn out.  Set up furniture so that there are clear paths. Keep the furniture in the same spot.  Fix any uneven floor surfaces.  Choose a carpet design that does not hide the edge of steps of a stairway.  Be aware of any and all pets.  Review your medicines with your healthcare provider. Some medicines can cause dizziness or changes in blood pressure, which increase your risk of falling. Talk with your health care provider about other ways that you can decrease your risk of falls. This may include working with a physical therapist or trainer to improve your strength, balance, and endurance. This information is not intended to replace advice  given to you by your health care provider. Make sure you discuss any questions you have with your health care provider. Document Released: 01/23/2002 Document Revised: 07/02/2015 Document Reviewed: 03/09/2014 Elsevier Interactive Patient Education  2017 [employer].

## 2016-09-09 ENCOUNTER — Other Ambulatory Visit: Payer: Self-pay | Admitting: Family Medicine

## 2016-10-13 ENCOUNTER — Other Ambulatory Visit: Payer: Self-pay | Admitting: Family Medicine

## 2016-11-05 ENCOUNTER — Encounter: Payer: Self-pay | Admitting: Family Medicine

## 2017-03-04 ENCOUNTER — Other Ambulatory Visit: Payer: Self-pay | Admitting: Family Medicine

## 2017-03-08 ENCOUNTER — Emergency Department (HOSPITAL_COMMUNITY): Payer: Medicare Other

## 2017-03-08 ENCOUNTER — Encounter (HOSPITAL_COMMUNITY): Payer: Self-pay | Admitting: Emergency Medicine

## 2017-03-08 ENCOUNTER — Emergency Department (HOSPITAL_COMMUNITY)
Admission: EM | Admit: 2017-03-08 | Discharge: 2017-03-08 | Disposition: A | Discharge status: Home / Self Care | Payer: Medicare Other | Attending: Emergency Medicine | Admitting: Emergency Medicine

## 2017-03-08 DIAGNOSIS — Z7982 Long term (current) use of aspirin: Secondary | ICD-10-CM | POA: Diagnosis not present

## 2017-03-08 DIAGNOSIS — S8012XA Contusion of left lower leg, initial encounter: Secondary | ICD-10-CM | POA: Insufficient documentation

## 2017-03-08 DIAGNOSIS — R2242 Localized swelling, mass and lump, left lower limb: Secondary | ICD-10-CM | POA: Diagnosis not present

## 2017-03-08 DIAGNOSIS — W208XXA Other cause of strike by thrown, projected or falling object, initial encounter: Secondary | ICD-10-CM | POA: Insufficient documentation

## 2017-03-08 DIAGNOSIS — Y998 Other external cause status: Secondary | ICD-10-CM | POA: Diagnosis not present

## 2017-03-08 DIAGNOSIS — I10 Essential (primary) hypertension: Secondary | ICD-10-CM | POA: Insufficient documentation

## 2017-03-08 DIAGNOSIS — Y9389 Activity, other specified: Secondary | ICD-10-CM | POA: Diagnosis not present

## 2017-03-08 DIAGNOSIS — Y929 Unspecified place or not applicable: Secondary | ICD-10-CM | POA: Diagnosis not present

## 2017-03-08 DIAGNOSIS — Z79899 Other long term (current) drug therapy: Secondary | ICD-10-CM | POA: Insufficient documentation

## 2017-03-08 DIAGNOSIS — S8992XA Unspecified injury of left lower leg, initial encounter: Secondary | ICD-10-CM | POA: Diagnosis present

## 2017-03-08 NOTE — Discharge Instructions (Signed)
Take your usual prescriptions as directed.  Apply moist heat to the area(s) of discomfort, for 15 minutes at a time, several times per day for the next few days.  Do not fall asleep on a heating pack.  Call your regular medical doctor today to schedule a follow up appointment this week.  Return to the Emergency Department immediately if worsening.

## 2017-03-08 NOTE — ED Provider Notes (Signed)
[facility] EMERGENCY DEPARTMENT Provider Note   CSN: [id number] Arrival date & time: 03/08/17  0093     History   Chief Complaint Chief Complaint  Patient presents with  . Leg Injury    HPI Jose King is a 77 y.o. male.  HPI  Pt was seen at [facility]. Per pt, c/o gradual onset and persistence of constant left lower leg "bruising" that began 6 days ago. Pt states last Tuesday he cut down a tree and it hit his left mid-lateral lower leg. Pt has been ambulatory since the injury.  Pt states he has been applying ice and heat, but the pain and swelling to the area continues. Pt takes ASA 81mg  PO daily. Denies any other blood thinners, no new injury, no other injuries, no joint pain.  enies focal motor deficits, no tingling/numbness in extremities, no fevers, no rash.   Past Medical History:  Diagnosis Date  . Hyperlipidemia   . Macular degeneration     Patient Active Problem List   Diagnosis Date Noted  . Essential hypertension 07/22/2015  . History of colonic polyps   . FH: colon cancer in first degree relative <23 years old 04/23/2014  . Hx of adenomatous colonic polyps 04/23/2014  . Ectopic beats 02/17/2013  . Hyperlipidemia 05/09/2009    Past Surgical History:  Procedure Laterality Date  . BACK SURGERY  1985  . BASAL CELL CARCINOMA EXCISION  2017  . COLONOSCOPY  05/10/2009   RMR: normal. repeat 5 years  . COLONOSCOPY  2003   RMR: normal  . COLONOSCOPY  1999   RMR: adenomatous ICV  . COLONOSCOPY N/A 05/09/2014   Procedure: COLONOSCOPY;  Surgeon: Daneil Dolin, MD;  Location: [facility] SUITE;  Service: Endoscopy;  Laterality: N/A;  9:15am  . [place]  . PROSTATE SURGERY    . TONSILLECTOMY AND ADENOIDECTOMY  1948       Home Medications    Prior to Admission medications   Medication Sig Start Date End Date Taking? Authorizing Provider  amLODipine (NORVASC) 5 MG tablet take 1 tablet by mouth once daily 03/05/17   Burchette, Alinda Sierras, MD  aspirin  81 MG tablet Take 81 mg by mouth daily.      [provider]  atorvastatin (LIPITOR) 20 MG tablet take 1 tablet by mouth once daily 10/14/16   Burchette, Alinda Sierras, MD  calcium-vitamin D (OSCAL) 250-125 MG-UNIT per tablet Take 1 tablet by mouth 3 (three) times a week.     [provider]  Multiple Vitamins-Minerals (OCUVITE PRESERVISION) TABS Take 1 tablet by mouth 2 (two) times daily.     [provider]    Family History Family History  Problem Relation Age of Onset  . Cancer Mother 43       colon  . Heart attack Father 74  . Suicidality Brother   . Cancer Brother 17       Pancreatic  . Lymphoma Brother   . Cancer Other        grandfather, mouth  . Cancer Other        grandmother, intestional   . Vision loss Paternal Uncle        Massive Stroke    Social History Social History   Tobacco Use  . Smoking status: Never Smoker  . Smokeless tobacco: Never Used  Substance Use Topics  . Alcohol use: No  . Drug use: No     Allergies   Patient has no known allergies.  Review of Systems Review of Systems ROS: Statement: All systems negative except as marked or noted in the HPI; Constitutional: Negative for fever and chills. ; ; Eyes: Negative for eye pain, redness and discharge. ; ; ENMT: Negative for ear pain, hoarseness, nasal congestion, sinus pressure and sore throat. ; ; Cardiovascular: Negative for chest pain, palpitations, diaphoresis, dyspnea and peripheral edema. ; ; Respiratory: Negative for cough, wheezing and stridor. ; ; Gastrointestinal: Negative for nausea, vomiting, diarrhea, abdominal pain, blood in stool, hematemesis, jaundice and rectal bleeding. . ; ; Genitourinary: Negative for dysuria, flank pain and hematuria. ; ; Musculoskeletal: Negative for back pain and neck pain. +LLE bruising, swelling and trauma.; ; Skin: Negative for pruritus, rash, abrasions, blisters, and skin lesion.; ; Neuro: Negative for headache, lightheadedness and neck  stiffness. Negative for weakness, altered level of consciousness, altered mental status, extremity weakness, paresthesias, involuntary movement, seizure and syncope.       Physical Exam Updated Vital Signs BP (!) 165/92 (BP Location: Right Arm)   Pulse 80   Resp 16   SpO2 99%   Physical Exam 0825: Physical examination:  Nursing notes reviewed; Vital signs and O2 SAT reviewed;  Constitutional: Well developed, Well nourished, Well hydrated, In no acute distress; Head:  Normocephalic, atraumatic; Eyes: EOMI, PERRL, No scleral icterus; ENMT: Mouth and pharynx normal, Mucous membranes moist; Neck: Supple, Full range of motion, No lymphadenopathy; Cardiovascular: Regular rate and rhythm, No gallop; Respiratory: Breath sounds clear & equal bilaterally, No wheezes.  Speaking full sentences with ease, Normal respiratory effort/excursion; Chest: Nontender, Movement normal; Abdomen: Soft, Nontender, Nondistended, Normal bowel sounds; Genitourinary: No CVA tenderness; Extremities: Pulses normal, Pelvis stable. NT left hip/knee/ankle/foot. +fading ecchymosis extending from left posterior-lateral thigh to lower leg to dorsal-lateral foot. No open wounds, no erythema, no palp fluctuance. +1 edema left lateral mid-calf with asymmetry.; Neuro: AA&Ox3, Major CN grossly intact.  Speech clear. No gross focal motor or sensory deficits in extremities.; Skin: Color normal, Warm, Dry.   ED Treatments / Results  Labs (all labs ordered are listed, but only abnormal results are displayed)   EKG  EKG Interpretation None       Radiology   Procedures Procedures (including critical care time)  Medications Ordered in ED Medications - No data to display   Initial Impression / Assessment and Plan / ED Course  I have reviewed the triage vital signs and the nursing notes.  Pertinent labs & imaging results that were available during my care of the patient were reviewed by me and considered in my medical  decision making (see chart for details).  MDM Reviewed: previous chart, nursing note and vitals Interpretation: x-ray and ultrasound    Dg Tibia/fibula Left Result Date: 03/08/2017 CLINICAL DATA:  Trauma to the left lower leg 1 week ago with bruising EXAM: LEFT TIBIA AND FIBULA - 2 VIEW COMPARISON:  None. FINDINGS: There is no evidence of fracture or dislocation. Degenerative joint changes of left knee are noted. There is mild soft tissue swelling of the lateral ankle. IMPRESSION: No acute fracture or dislocation. Mild soft tissue swelling of the lateral ankle. Electronically Signed   By: Abelardo Diesel M.D.   On: 03/08/2017 09:24   US Venous Img Lower  Left (dvt Study) Result Date: 03/08/2017 CLINICAL DATA:  77 year old male with a history leg trauma EXAM: LEFT LOWER EXTREMITY VENOUS DOPPLER ULTRASOUND TECHNIQUE: Gray-scale sonography with graded compression, as well as color Doppler and duplex ultrasound were performed to evaluate the lower extremity deep venous  systems from the level of the common femoral vein and including the common femoral, femoral, profunda femoral, popliteal and calf veins including the posterior tibial, peroneal and gastrocnemius veins when visible. The superficial great saphenous vein was also interrogated. Spectral Doppler was utilized to evaluate flow at rest and with distal augmentation maneuvers in the common femoral, femoral and popliteal veins. COMPARISON:  None. FINDINGS: Contralateral Common Femoral Vein: Respiratory phasicity is normal and symmetric with the symptomatic side. No evidence of thrombus. Normal compressibility. Common Femoral Vein: No evidence of thrombus. Normal compressibility, respiratory phasicity and response to augmentation. Saphenofemoral Junction: No evidence of thrombus. Normal compressibility and flow on color Doppler imaging. Profunda Femoral Vein: No evidence of thrombus. Normal compressibility and flow on color Doppler imaging. Femoral Vein: No  evidence of thrombus. Normal compressibility, respiratory phasicity and response to augmentation. Popliteal Vein: No evidence of thrombus. Normal compressibility, respiratory phasicity and response to augmentation. Calf Veins: No evidence of thrombus. Normal compressibility and flow on color Doppler imaging. Superficial Great Saphenous Vein: No evidence of thrombus. Normal compressibility and flow on color Doppler imaging. Other Findings: Complex lentiform shaped fluid collection with internal septations and mixed echogenicity of the anterior lower leg. Fluid collection measures 12.3 cm x 1.6 cm x 4.6 cm with no internal flow. IMPRESSION: Sonographic survey of the left lower extremity negative for DVT. Lentiform fluid collection with the anterior aspect of the lower left leg measuring as large as 12.3 cm. Differential includes evolving hematoma, complex seroma, or less likely infection. Signed, Dulcy Fanny. Earleen Newport, DO [facility] Radiology Electronically Signed   By: Corrie Mckusick D.O.   On: 03/08/2017 09:35  '  0945:  [place] with hematoma. Pt not on any blood thinners. Tx symptomatically at this time. Pt states he "feels reassured" and wants to go home now. Dx and testing d/w pt and family.  Questions answered.  Verb understanding, agreeable to d/c home with outpt f/u.    Final Clinical Impressions(s) / ED Diagnoses   Final diagnoses:  None    ED Discharge Orders    None       Francine Graven, DO 03/10/17 1530

## 2017-03-08 NOTE — ED Triage Notes (Signed)
Pt reports he was cutting a tree last Tuesday and it fell across his left leg.  Ice and heat applied, but knot and pain continue.

## 2017-07-14 ENCOUNTER — Ambulatory Visit (INDEPENDENT_AMBULATORY_CARE_PROVIDER_SITE_OTHER): Payer: Medicare Other | Admitting: Family Medicine

## 2017-07-14 ENCOUNTER — Encounter: Payer: Self-pay | Admitting: Family Medicine

## 2017-07-14 VITALS — BP 140/80 | HR 68 | Temp 98.0°F | Ht 68.0 in | Wt 187.8 lb

## 2017-07-14 DIAGNOSIS — E785 Hyperlipidemia, unspecified: Secondary | ICD-10-CM

## 2017-07-14 DIAGNOSIS — Z0000 Encounter for general adult medical examination without abnormal findings: Secondary | ICD-10-CM

## 2017-07-14 DIAGNOSIS — I10 Essential (primary) hypertension: Secondary | ICD-10-CM | POA: Diagnosis not present

## 2017-07-14 DIAGNOSIS — D696 Thrombocytopenia, unspecified: Secondary | ICD-10-CM | POA: Diagnosis not present

## 2017-07-14 LAB — BASIC METABOLIC PANEL
BUN: 20 mg/dL (ref 6–23)
CALCIUM: 9.1 mg/dL (ref 8.4–10.5)
CO2: 29 mEq/L (ref 19–32)
CREATININE: 0.98 mg/dL (ref 0.40–1.50)
Chloride: 105 mEq/L (ref 96–112)
GFR: 78.87 mL/min (ref >60.00)
GLUCOSE: 96 mg/dL (ref 70–99)
Potassium: 3.9 mEq/L (ref 3.5–5.1)
SODIUM: 140 meq/L (ref 135–145)

## 2017-07-14 LAB — LIPID PANEL
CHOL/HDL RATIO: 3
CHOLESTEROL: 164 mg/dL (ref 0–200)
HDL: 49 mg/dL (ref >39.00)
LDL Cholesterol: 86 mg/dL (ref 0–99)
NonHDL: 115.23
Triglycerides: 145 mg/dL (ref 0.0–149.0)
VLDL: 29 mg/dL (ref 0.0–40.0)

## 2017-07-14 LAB — CBC WITH DIFFERENTIAL/PLATELET
BASOS ABS: 0 10*3/uL (ref 0.0–0.1)
Basophils Relative: 0.4 % (ref 0.0–3.0)
EOS ABS: 0.1 10*3/uL (ref 0.0–0.7)
Eosinophils Relative: 1.8 % (ref 0.0–5.0)
HCT: 44 % (ref 39.0–52.0)
Hemoglobin: 15.1 g/dL (ref 13.0–17.0)
Lymphocytes Relative: 37.8 % (ref 12.0–46.0)
Lymphs Abs: 1.5 10*3/uL (ref 0.7–4.0)
MCHC: 34.4 g/dL (ref 30.0–36.0)
MCV: 86.9 fl (ref 78.0–100.0)
MONO ABS: 0.4 10*3/uL (ref 0.1–1.0)
MONOS PCT: 9.5 % (ref 3.0–12.0)
Neutro Abs: 2 10*3/uL (ref 1.4–7.7)
Neutrophils Relative %: 50.5 % (ref 43.0–77.0)
PLATELETS: 123 10*3/uL — AB (ref 150.0–400.0)
RBC: 5.07 Mil/uL (ref 4.22–5.81)
RDW: 13.7 % (ref 11.5–15.5)
WBC: 3.9 10*3/uL — ABNORMAL LOW (ref 4.0–10.5)

## 2017-07-14 LAB — HEPATIC FUNCTION PANEL
ALT: 19 U/L (ref 0–53)
AST: 16 U/L (ref 0–37)
Albumin: 4.2 g/dL (ref 3.5–5.2)
Alkaline Phosphatase: 104 U/L (ref 39–117)
Bilirubin, Direct: 0.1 mg/dL (ref 0.0–0.3)
TOTAL PROTEIN: 6.7 g/dL (ref 6.0–8.3)
Total Bilirubin: 0.7 mg/dL (ref 0.2–1.2)

## 2017-07-14 MED ORDER — ATORVASTATIN CALCIUM 20 MG PO TABS: 20.0000 mg | ORAL_TABLET | Freq: Every day | ORAL | 3 refills | Status: DC

## 2017-07-14 MED ORDER — AMLODIPINE BESYLATE 5 MG PO TABS
5.0000 mg | ORAL_TABLET | Freq: Every day | ORAL | 3 refills | Status: DC
Start: 2017-07-14 — End: 2018-08-17

## 2017-07-14 NOTE — Patient Instructions (Signed)
Monitor blood pressure and be in touch if consistently > 140/90.   

## 2017-07-14 NOTE — Progress Notes (Signed)
Subjective:     Patient ID: Jose King, male   DOB: 06/05/1940, 77 y.o.   MRN: [id number]  HPI Patient seen for physical exam. He has history of hypertension, hyperlipidemia, and mildly depressed platelets. His takes medications including amlodipine and atorvastatin. No history of stroke or CAD. No aspirin use. Never smoked.  He has macular degeneration and cataracts and followed by ophthalmology with pending follow-up soon. He states immunizations are up-to-date. He had new shingles vaccine through pharmacy but we have no record. He states he had both pneumonia vaccines. Generally feels well. Some increased fatigue over the years but no other specific complaints.  Family history reviewed. His mother had colon cancer. He gets regular colonoscopies every 5 years through GI and is up-to-date  Past Medical History:  Diagnosis Date  . Hyperlipidemia   . Macular degeneration    Past Surgical History:  Procedure Laterality Date  . BACK SURGERY  1985  . BASAL CELL CARCINOMA EXCISION  2017  . COLONOSCOPY  05/10/2009   RMR: normal. repeat 5 years  . COLONOSCOPY  2003   RMR: normal  . COLONOSCOPY  1999   RMR: adenomatous ICV  . COLONOSCOPY N/A 05/09/2014   Procedure: COLONOSCOPY;  Surgeon: Daneil Dolin, MD;  Location: [facility];  Service: Endoscopy;  Laterality: N/A;  9:15am  . [place]  . PROSTATE SURGERY    . [place]    reports that he has never smoked. He has never used smokeless tobacco. He reports that he does not drink alcohol or use drugs. family history includes Cancer in his other and other; Cancer (age of onset: 32) in his mother; Cancer (age of onset: 14) in his brother; Heart attack (age of onset: 69) in his father; Lymphoma in his brother; Suicidality in his brother; Vision loss in his paternal uncle. No Known Allergies   Review of Systems  Constitutional: Positive for fatigue. Negative for activity change, appetite  change, fever and unexpected weight change.  HENT: Negative for congestion, ear pain and trouble swallowing.   Eyes: Negative for pain and visual disturbance.  Respiratory: Negative for cough, shortness of breath and wheezing.   Cardiovascular: Negative for chest pain and palpitations.  Gastrointestinal: Negative for abdominal distention, abdominal pain, blood in stool, constipation, diarrhea, nausea, rectal pain and vomiting.  Genitourinary: Negative for dysuria, hematuria and testicular pain.  Musculoskeletal: Positive for arthralgias. Negative for joint swelling.  Skin: Negative for rash.  Neurological: Negative for dizziness, syncope and headaches.  Hematological: Negative for adenopathy.  Psychiatric/Behavioral: Negative for confusion and dysphoric mood.       Objective:   Physical Exam  Constitutional: He is oriented to person, place, and time. He appears well-developed and well-nourished. No distress.  HENT:  Head: Normocephalic and atraumatic.  Right Ear: External ear normal.  Left Ear: External ear normal.  Mouth/Throat: Oropharynx is clear and moist.  Eyes: Pupils are equal, round, and reactive to light. Conjunctivae and EOM are normal.  Neck: Normal range of motion. Neck supple. No thyromegaly present.  Cardiovascular: Normal rate, regular rhythm and normal heart sounds.  No murmur heard. Pulmonary/Chest: No respiratory distress. He has no wheezes. He has no rales.  Abdominal: Soft. Bowel sounds are normal. He exhibits no distension and no mass. There is no tenderness. There is no rebound and no guarding.  Musculoskeletal: He exhibits no edema.  Lymphadenopathy:    He has no cervical adenopathy.  Neurological: He is alert and oriented  to person, place, and time. He displays normal reflexes. No cranial nerve deficit.  Skin: No rash noted.  Psychiatric: He has a normal mood and affect.       Assessment:     #1 physical exam. Issues addressed as below  #2 history of  chronic mild thrombocytopenia. Recheck CBC  #3 history of hyperlipidemia. Patient on Lipitor. Rechecking lipids and hepatic panel  #4 hypertension marginal control. He states he has been consistently around 035 or less systolic at home and around 80 diastolic.    Plan:     -Refill atorvastatin and amlodipine for one year -Continue to monitor blood pressure closely and be in touch if consistently greater than 140/90 -Continue with yearly flu vaccine. Other immunizations up-to-date -Recheck CBC with history of thrombocytopenia -Recheck lipid and hepatic with patient on Lipitor -Recheck basic metabolic panel with his hypertension history  Eulas Post MD  Primary Care at [facility]

## 2017-09-28 ENCOUNTER — Other Ambulatory Visit: Payer: Self-pay | Admitting: Family Medicine

## 2017-12-21 NOTE — Progress Notes (Addendum)
Subjective:   Jose King is a 77 y.o. male who presents for Medicare Annual/Subsequent preventive examination.  Reports health as very good  Describes health as good, fair or great? 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   States he had pneumonia vaccines at a practice prior to coming here and wife agreed that he has completed the series Pneumonia series taken out of metric   PSA 05/2015 Colonoscopy  04/2014        Objective:    Vitals: BP 126/80   Pulse 71   Ht 5\' 10"  (1.778 m)   Wt 188 lb (85.3 kg)   SpO2 95%   BMI 26.98 kg/m   Body mass index is 26.98 kg/m.  Advanced Directives 03/08/2017 06/19/2016 05/09/2014  Does Patient Have a Medical Advance Directive? No No No;Yes  Type of Advance Directive - - Living will   Yes they have completed this   Tobacco Social History   Tobacco Use  Smoking Status Never Smoker  Smokeless Tobacco Never Used     Counseling given: Yes   Clinical Intake:     Past Medical History:  Diagnosis Date  . Hyperlipidemia   . Macular degeneration    Past Surgical History:  Procedure Laterality Date    . BACK SURGERY  1985  . BASAL CELL CARCINOMA EXCISION  2017  . COLONOSCOPY  05/10/2009   RMR: normal. repeat 5 years  . COLONOSCOPY  2003   RMR: normal  . COLONOSCOPY  1999   RMR: adenomatous ICV  . COLONOSCOPY N/A 05/09/2014   Procedure: COLONOSCOPY;  Surgeon: Daneil Dolin, MD;  Location: [facility];  Service: Endoscopy;  Laterality: N/A;  9:15am  . [place]  . PROSTATE SURGERY    . TONSILLECTOMY AND ADENOIDECTOMY  1948   Family History  Problem Relation Age of Onset  . Cancer Mother 22       colon  . Heart attack Father 47  . Suicidality Brother   . Cancer Brother 53       Pancreatic  . Lymphoma Brother   . Cancer Other        grandfather, mouth  . Cancer Other        grandmother, intestional   . Vision loss Paternal Uncle        Massive Stroke   Social History   Socioeconomic History  . Marital status: Married    Spouse name: Not on file  . Number of children: 0  . Years of education: Not on file  . Highest education  level: Not on file  Occupational History  . Occupation: Retired  Scientific laboratory technician  . Financial resource strain: Not on file  . Food insecurity:    Worry: Not on file    Inability: Not on file  . Transportation needs:    Medical: Not on file    Non-medical: Not on file  Tobacco Use  . Smoking status: Never Smoker  . Smokeless tobacco: Never Used  Substance and Sexual Activity  . Alcohol use: No  . Drug use: No  . Sexual activity: Not on file  Lifestyle  . Physical activity:    Days per week: Not on file    Minutes per session: Not on file  . Stress: Not on file  Relationships  . Social connections:    Talks on phone: Not on file    Gets together: Not on file    Attends religious service: Not on file    Active member of club or organization: Not on file    Attends meetings of clubs or organizations: Not on file    Relationship status: Not on file  Other Topics Concern  . Not on file  Social History Narrative    Married   Gets regular exercise    Outpatient Encounter Medications as of 12/22/2017  Medication Sig  . amLODipine (NORVASC) 5 MG tablet Take 1 tablet (5 mg total) by mouth daily.  Marland Kitchen aspirin 81 MG tablet Take 81 mg by mouth daily.    Marland Kitchen atorvastatin (LIPITOR) 20 MG tablet TAKE 1 TABLET BY MOUTH ONCE DAILY  . calcium-vitamin D (OSCAL) 250-125 MG-UNIT per tablet Take 1 tablet by mouth 3 (three) times a week.   . Multiple Vitamins-Minerals (OCUVITE PRESERVISION) TABS Take 1 tablet by mouth 2 (two) times daily.    No facility-administered encounter medications on file as of 12/22/2017.     Activities of Daily Living In your present state of health, do you have any difficulty performing the following activities: 12/22/2017  Hearing? Y  Vision? N  Difficulty concentrating or making decisions? N  Walking or climbing stairs? N  Dressing or bathing? N  Doing errands, shopping? N  Preparing Food and eating ? N  Using the Toilet? N  In the past six months, have you accidently leaked urine? N  Do you have problems with loss of bowel control? N  Managing your Medications? N  Managing your Finances? N  Housekeeping or managing your Housekeeping? N  Some recent data might be hidden    Patient Care Team: Eulas Post, MD as PCP - General (Family Medicine) Gala Romney Cristopher Estimable, MD as Consulting Physician (Gastroenterology)   Assessment:   This is a routine wellness examination for Dariel.  Exercise Activities and Dietary recommendations    Goals    . DIET - REDUCE SUGAR INTAKE     Cut back on sweets; jellies; cake and pies      . Reduce sugar intake to X grams per day     Cut back on sweets and eat more         Fall Risk Fall Risk  12/22/2017 07/14/2017 06/19/2016 06/19/2015  Falls in the past year? 0 No No No    Depression Screen PHQ 2/9 Scores 12/22/2017 07/14/2017 06/19/2016 06/19/2015  PHQ - 2 Score 0 0 0 0    Cognitive Function MMSE - Mini Mental State Exam 12/22/2017 06/19/2016    Not completed: (No Data) (No Data)   Ad8 score reviewed for issues:  Issues making  decisions:  Less interest in hobbies / activities:  Repeats questions, stories (family complaining):  Trouble using ordinary gadgets (microwave, computer, phone):  Forgets the month or year:   Mismanaging finances:   Remembering appts:  Daily problems with thinking and/or memory: Ad8 score is=0       6CIT Screen 06/19/2016  What Year? 0 points  What month? 0 points  What time? 0 points  Count back from 20 0 points  Months in reverse 0 points  Repeat phrase 0 points  Total Score 0   Both are very familiar with dementia and no issues at present of which they both agree.  Spouse maintains a very busy schedule   Immunization History  Administered Date(s) Administered  . Influenza, High Dose Seasonal PF 11/18/2017  . Influenza-Unspecified 11/14/2016  . Tdap 06/19/2015      Screening Tests Health Maintenance  Topic Date Due  . TETANUS/TDAP  06/18/2025  . INFLUENZA VACCINE  Completed  . PNA vac Low Risk Adult  Completed       Plan:      PCP Notes  Health Maintenance States he had pneumonia vaccines at a practice prior to coming here and wife agreed that he has completed the series Pneumonia series taken out of metric   PSA 05/2015 Colonoscopy  04/2014  Abnormal Screens  none  Referrals  none  Patient concerns; None; doing well; loves their home and volunteering   Nurse Concerns; none  Next PCP apt OV planned in late May or June TBS       I have personally reviewed and noted the following in the patient's chart:   . Medical and social history . Use of alcohol, tobacco or illicit drugs  . Current medications and supplements . Functional ability and status . Nutritional status . Physical activity . Advanced directives . List of other physicians . Hospitalizations, surgeries, and ER visits in previous 12 months . Vitals . Screenings to include  cognitive, depression, and falls . Referrals and appointments  In addition, I have reviewed and discussed with patient certain preventive protocols, quality metrics, and best practice recommendations. A written personalized care plan for preventive services as well as general preventive health recommendations were provided to patient.     VZCHY,IFOYD, RN  12/22/2017  I have reviewed the documentation for the [facility] and [place] provided by the health coach and agree with their documentation. I was immediately available for any questions  Eulas Post MD [facility]

## 2017-12-22 ENCOUNTER — Ambulatory Visit (INDEPENDENT_AMBULATORY_CARE_PROVIDER_SITE_OTHER): Payer: Medicare Other

## 2017-12-22 VITALS — BP 126/80 | HR 71 | Ht 70.0 in | Wt 188.0 lb

## 2017-12-22 DIAGNOSIS — Z0000 Encounter for general adult medical examination without abnormal findings: Secondary | ICD-10-CM | POA: Diagnosis not present

## 2017-12-22 NOTE — Patient Instructions (Addendum)
Jose King , Thank you for taking time to come for your Medicare Wellness Visit. I appreciate your ongoing commitment to your health goals. Please review the following plan we discussed and let me know if I can assist you in the future.   These are the goals we discussed: Goals    . DIET - REDUCE SUGAR INTAKE     Cut back on sweets; jellies; cake and pies      . Reduce sugar intake to X grams per day     Cut back on sweets and eat more         This is a list of the screening recommended for you and due dates:  Health Maintenance  Topic Date Due  . Tetanus Vaccine  06/18/2025  . Flu Shot  Completed  . Pneumonia vaccines  Completed     Fall Prevention in the Home Falls can cause injuries. They can happen to people of all ages. There are many things you can do to make your home safe and to help prevent falls. What can I do on the outside of my home?  Regularly fix the edges of walkways and driveways and fix any cracks.  Remove anything that might make you trip as you walk through a door, such as a raised step or threshold.  Trim any bushes or trees on the path to your home.  Use bright outdoor lighting.  Clear any walking paths of anything that might make someone trip, such as rocks or tools.  Regularly check to see if handrails are loose or broken. Make sure that both sides of any steps have handrails.  Any raised decks and porches should have guardrails on the edges.  Have any leaves, snow, or ice cleared regularly.  Use sand or salt on walking paths during winter.  Clean up any spills in your garage right away. This includes oil or grease spills. What can I do in the bathroom?  Use night lights.  Install grab bars by the toilet and in the tub and shower. Do not use towel bars as grab bars.  Use non-skid mats or decals in the tub or shower.  If you need to sit down in the shower, use a plastic, non-slip stool.  Keep the floor dry. Clean up any water that  spills on the floor as soon as it happens.  Remove soap buildup in the tub or shower regularly.  Attach bath mats securely with double-sided non-slip rug tape.  Do not have throw rugs and other things on the floor that can make you trip. What can I do in the bedroom?  Use night lights.  Make sure that you have a light by your bed that is easy to reach.  Do not use any sheets or blankets that are too big for your bed. They should not hang down onto the floor.  Have a firm chair that has side arms. You can use this for support while you get dressed.  Do not have throw rugs and other things on the floor that can make you trip. What can I do in the kitchen?  Clean up any spills right away.  Avoid walking on wet floors.  Keep items that you use a lot in easy-to-reach places.  If you need to reach something above you, use a strong step stool that has a grab bar.  Keep electrical cords out of the way.  Do not use floor polish or wax that makes floors slippery. If  you must use wax, use non-skid floor wax.  Do not have throw rugs and other things on the floor that can make you trip. What can I do with my stairs?  Do not leave any items on the stairs.  Make sure that there are handrails on both sides of the stairs and use them. Fix handrails that are broken or loose. Make sure that handrails are as long as the stairways.  Check any carpeting to make sure that it is firmly attached to the stairs. Fix any carpet that is loose or worn.  Avoid having throw rugs at the top or bottom of the stairs. If you do have throw rugs, attach them to the floor with carpet tape.  Make sure that you have a light switch at the top of the stairs and the bottom of the stairs. If you do not have them, ask someone to add them for you. What else can I do to help prevent falls?  Wear shoes that: ? Do not have high heels. ? Have rubber bottoms. ? Are comfortable and fit you well. ? Are closed at the  toe. Do not wear sandals.  If you use a stepladder: ? Make sure that it is fully opened. Do not climb a closed stepladder. ? Make sure that both sides of the stepladder are locked into place. ? Ask someone to hold it for you, if possible.  Clearly mark and make sure that you can see: ? Any grab bars or handrails. ? First and last steps. ? Where the edge of each step is.  Use tools that help you move around (mobility aids) if they are needed. These include: ? Canes. ? Walkers. ? Scooters. ? Crutches.  Turn on the lights when you go into a dark area. Replace any light bulbs as soon as they burn out.  Set up your furniture so you have a clear path. Avoid moving your furniture around.  If any of your floors are uneven, fix them.  If there are any pets around you, be aware of where they are.  Review your medicines with your doctor. Some medicines can make you feel dizzy. This can increase your chance of falling. Ask your doctor what other things that you can do to help prevent falls. This information is not intended to replace advice given to you by your health care provider. Make sure you discuss any questions you have with your health care provider. Document Released: 11/29/2008 Document Revised: 07/11/2015 Document Reviewed: 03/09/2014 Elsevier Interactive Patient Education  2018 [place] Maintenance, Male A healthy lifestyle and preventive care is important for your health and wellness. Ask your health care provider about what schedule of regular examinations is right for you. What should I know about weight and diet? Eat a Healthy Diet  Eat plenty of vegetables, fruits, whole grains, low-fat dairy products, and lean protein.  Do not eat a lot of foods high in solid fats, added sugars, or salt.  Maintain a Healthy Weight Regular exercise can help you achieve or maintain a healthy weight. You should:  Do at least 150 minutes of exercise each week. The  exercise should increase your heart rate and make you sweat (moderate-intensity exercise).  Do strength-training exercises at least twice a week.  Watch Your Levels of Cholesterol and Blood Lipids  Have your blood tested for lipids and cholesterol every 5 years starting at 77 years of age. If you are at high risk for heart disease,  you should start having your blood tested when you are 77 years old. You may need to have your cholesterol levels checked more often if: ? Your lipid or cholesterol levels are high. ? You are older than 77 years of age. ? You are at high risk for heart disease.  What should I know about cancer screening? Many types of cancers can be detected early and may often be prevented. Lung Cancer  You should be screened every year for lung cancer if: ? You are a current smoker who has smoked for at least 30 years. ? You are a former smoker who has quit within the past 15 years.  Talk to your health care provider about your screening options, when you should start screening, and how often you should be screened.  Colorectal Cancer  Routine colorectal cancer screening usually begins at 77 years of age and should be repeated every 5-10 years until you are 77 years old. You may need to be screened more often if early forms of precancerous polyps or small growths are found. Your health care provider may recommend screening at an earlier age if you have risk factors for colon cancer.  Your health care provider may recommend using home test kits to check for hidden blood in the stool.  A small camera at the end of a tube can be used to examine your colon (sigmoidoscopy or colonoscopy). This checks for the earliest forms of colorectal cancer.  Prostate and Testicular Cancer  Depending on your age and overall health, your health care provider may do certain tests to screen for prostate and testicular cancer.  Talk to your health care provider about any symptoms or concerns  you have about testicular or prostate cancer.  Skin Cancer  Check your skin from head to toe regularly.  Tell your health care provider about any new moles or changes in moles, especially if: ? There is a change in a mole's size, shape, or color. ? You have a mole that is larger than a pencil eraser.  Always use sunscreen. Apply sunscreen liberally and repeat throughout the day.  Protect yourself by wearing long sleeves, pants, a wide-brimmed hat, and sunglasses when outside.  What should I know about heart disease, diabetes, and high blood pressure?  If you are 41-73 years of age, have your blood pressure checked every 3-5 years. If you are 36 years of age or older, have your blood pressure checked every year. You should have your blood pressure measured twice-once when you are at a hospital or clinic, and once when you are not at a hospital or clinic. Record the average of the two measurements. To check your blood pressure when you are not at a hospital or clinic, you can use: ? An automated blood pressure machine at a pharmacy. ? A home blood pressure monitor.  Talk to your health care provider about your target blood pressure.  If you are between 24-52 years old, ask your health care provider if you should take aspirin to prevent heart disease.  Have regular diabetes screenings by checking your fasting blood sugar level. ? If you are at a normal weight and have a low risk for diabetes, have this test once every three years after the age of 68. ? If you are overweight and have a high risk for diabetes, consider being tested at a younger age or more often.  A one-time screening for abdominal aortic aneurysm (AAA) by ultrasound is recommended for men aged 55-75  years who are current or former smokers. What should I know about preventing infection? Hepatitis B If you have a higher risk for hepatitis B, you should be screened for this virus. Talk with your health care provider to find  out if you are at risk for hepatitis B infection. Hepatitis C Blood testing is recommended for:  Everyone born from [birth year] through 1965.  Anyone with known risk factors for hepatitis C.  Sexually Transmitted Diseases (STDs)  You should be screened each year for STDs including gonorrhea and chlamydia if: ? You are sexually active and are younger than 77 years of age. ? You are older than 77 years of age and your health care provider tells you that you are at risk for this type of infection. ? Your sexual activity has changed since you were last screened and you are at an increased risk for chlamydia or gonorrhea. Ask your health care provider if you are at risk.  Talk with your health care provider about whether you are at high risk of being infected with HIV. Your health care provider may recommend a prescription medicine to help prevent HIV infection.  What else can I do?  Schedule regular health, dental, and eye exams.  Stay current with your vaccines (immunizations).  Do not use any tobacco products, such as cigarettes, chewing tobacco, and e-cigarettes. If you need help quitting, ask your health care provider.  Limit alcohol intake to no more than 2 drinks per day. One drink equals 12 ounces of beer, 5 ounces of wine, or 1 ounces of hard liquor.  Do not use street drugs.  Do not share needles.  Ask your health care provider for help if you need support or information about quitting drugs.  Tell your health care provider if you often feel depressed.  Tell your health care provider if you have ever been abused or do not feel safe at home. This information is not intended to replace advice given to you by your health care provider. Make sure you discuss any questions you have with your health care provider. Document Released: 08/01/2007 Document Revised: 10/02/2015 Document Reviewed: 11/06/2014 Elsevier Interactive Patient Education  2018 [place] in  the Home Falls can cause injuries. They can happen to people of all ages. There are many things you can do to make your home safe and to help prevent falls. What can I do on the outside of my home?  Regularly fix the edges of walkways and driveways and fix any cracks.  Remove anything that might make you trip as you walk through a door, such as a raised step or threshold.  Trim any bushes or trees on the path to your home.  Use bright outdoor lighting.  Clear any walking paths of anything that might make someone trip, such as rocks or tools.  Regularly check to see if handrails are loose or broken. Make sure that both sides of any steps have handrails.  Any raised decks and porches should have guardrails on the edges.  Have any leaves, snow, or ice cleared regularly.  Use sand or salt on walking paths during winter.  Clean up any spills in your garage right away. This includes oil or grease spills. What can I do in the bathroom?  Use night lights.  Install grab bars by the toilet and in the tub and shower. Do not use towel bars as grab bars.  Use non-skid mats or decals in the tub or  shower.  If you need to sit down in the shower, use a plastic, non-slip stool.  Keep the floor dry. Clean up any water that spills on the floor as soon as it happens.  Remove soap buildup in the tub or shower regularly.  Attach bath mats securely with double-sided non-slip rug tape.  Do not have throw rugs and other things on the floor that can make you trip. What can I do in the bedroom?  Use night lights.  Make sure that you have a light by your bed that is easy to reach.  Do not use any sheets or blankets that are too big for your bed. They should not hang down onto the floor.  Have a firm chair that has side arms. You can use this for support while you get dressed.  Do not have throw rugs and other things on the floor that can make you trip. What can I do in the kitchen?  Clean up  any spills right away.  Avoid walking on wet floors.  Keep items that you use a lot in easy-to-reach places.  If you need to reach something above you, use a strong step stool that has a grab bar.  Keep electrical cords out of the way.  Do not use floor polish or wax that makes floors slippery. If you must use wax, use non-skid floor wax.  Do not have throw rugs and other things on the floor that can make you trip. What can I do with my stairs?  Do not leave any items on the stairs.  Make sure that there are handrails on both sides of the stairs and use them. Fix handrails that are broken or loose. Make sure that handrails are as long as the stairways.  Check any carpeting to make sure that it is firmly attached to the stairs. Fix any carpet that is loose or worn.  Avoid having throw rugs at the top or bottom of the stairs. If you do have throw rugs, attach them to the floor with carpet tape.  Make sure that you have a light switch at the top of the stairs and the bottom of the stairs. If you do not have them, ask someone to add them for you. What else can I do to help prevent falls?  Wear shoes that: ? Do not have high heels. ? Have rubber bottoms. ? Are comfortable and fit you well. ? Are closed at the toe. Do not wear sandals.  If you use a stepladder: ? Make sure that it is fully opened. Do not climb a closed stepladder. ? Make sure that both sides of the stepladder are locked into place. ? Ask someone to hold it for you, if possible.  Clearly mark and make sure that you can see: ? Any grab bars or handrails. ? First and last steps. ? Where the edge of each step is.  Use tools that help you move around (mobility aids) if they are needed. These include: ? Canes. ? Walkers. ? Scooters. ? Crutches.  Turn on the lights when you go into a dark area. Replace any light bulbs as soon as they burn out.  Set up your furniture so you have a clear path. Avoid moving your  furniture around.  If any of your floors are uneven, fix them.  If there are any pets around you, be aware of where they are.  Review your medicines with your doctor. Some medicines can make you feel  dizzy. This can increase your chance of falling. Ask your doctor what other things that you can do to help prevent falls. This information is not intended to replace advice given to you by your health care provider. Make sure you discuss any questions you have with your health care provider. Document Released: 11/29/2008 Document Revised: 07/11/2015 Document Reviewed: 03/09/2014 Elsevier Interactive Patient Education  2018 [place] Maintenance, Male A healthy lifestyle and preventive care is important for your health and wellness. Ask your health care provider about what schedule of regular examinations is right for you. What should I know about weight and diet? Eat a Healthy Diet  Eat plenty of vegetables, fruits, whole grains, low-fat dairy products, and lean protein.  Do not eat a lot of foods high in solid fats, added sugars, or salt.  Maintain a Healthy Weight Regular exercise can help you achieve or maintain a healthy weight. You should:  Do at least 150 minutes of exercise each week. The exercise should increase your heart rate and make you sweat (moderate-intensity exercise).  Do strength-training exercises at least twice a week.  Watch Your Levels of Cholesterol and Blood Lipids  Have your blood tested for lipids and cholesterol every 5 years starting at 77 years of age. If you are at high risk for heart disease, you should start having your blood tested when you are 77 years old. You may need to have your cholesterol levels checked more often if: ? Your lipid or cholesterol levels are high. ? You are older than 77 years of age. ? You are at high risk for heart disease.  What should I know about cancer screening? Many types of cancers can be detected early and  may often be prevented. Lung Cancer  You should be screened every year for lung cancer if: ? You are a current smoker who has smoked for at least 30 years. ? You are a former smoker who has quit within the past 15 years.  Talk to your health care provider about your screening options, when you should start screening, and how often you should be screened.  Colorectal Cancer  Routine colorectal cancer screening usually begins at 77 years of age and should be repeated every 5-10 years until you are 77 years old. You may need to be screened more often if early forms of precancerous polyps or small growths are found. Your health care provider may recommend screening at an earlier age if you have risk factors for colon cancer.  Your health care provider may recommend using home test kits to check for hidden blood in the stool.  A small camera at the end of a tube can be used to examine your colon (sigmoidoscopy or colonoscopy). This checks for the earliest forms of colorectal cancer.  Prostate and Testicular Cancer  Depending on your age and overall health, your health care provider may do certain tests to screen for prostate and testicular cancer.  Talk to your health care provider about any symptoms or concerns you have about testicular or prostate cancer.  Skin Cancer  Check your skin from head to toe regularly.  Tell your health care provider about any new moles or changes in moles, especially if: ? There is a change in a mole's size, shape, or color. ? You have a mole that is larger than a pencil eraser.  Always use sunscreen. Apply sunscreen liberally and repeat throughout the day.  Protect yourself by wearing long sleeves, pants, a  wide-brimmed hat, and sunglasses when outside.  What should I know about heart disease, diabetes, and high blood pressure?  If you are 43-88 years of age, have your blood pressure checked every 3-5 years. If you are 35 years of age or older, have your  blood pressure checked every year. You should have your blood pressure measured twice-once when you are at a hospital or clinic, and once when you are not at a hospital or clinic. Record the average of the two measurements. To check your blood pressure when you are not at a hospital or clinic, you can use: ? An automated blood pressure machine at a pharmacy. ? A home blood pressure monitor.  Talk to your health care provider about your target blood pressure.  If you are between 31-55 years old, ask your health care provider if you should take aspirin to prevent heart disease.  Have regular diabetes screenings by checking your fasting blood sugar level. ? If you are at a normal weight and have a low risk for diabetes, have this test once every three years after the age of 82. ? If you are overweight and have a high risk for diabetes, consider being tested at a younger age or more often.  A one-time screening for abdominal aortic aneurysm (AAA) by ultrasound is recommended for men aged 44-75 years who are current or former smokers. What should I know about preventing infection? Hepatitis B If you have a higher risk for hepatitis B, you should be screened for this virus. Talk with your health care provider to find out if you are at risk for hepatitis B infection. Hepatitis C Blood testing is recommended for:  Everyone born from [birth year] through 1965.  Anyone with known risk factors for hepatitis C.  Sexually Transmitted Diseases (STDs)  You should be screened each year for STDs including gonorrhea and chlamydia if: ? You are sexually active and are younger than 77 years of age. ? You are older than 77 years of age and your health care provider tells you that you are at risk for this type of infection. ? Your sexual activity has changed since you were last screened and you are at an increased risk for chlamydia or gonorrhea. Ask your health care provider if you are at risk.  Talk with your  health care provider about whether you are at high risk of being infected with HIV. Your health care provider may recommend a prescription medicine to help prevent HIV infection.  What else can I do?  Schedule regular health, dental, and eye exams.  Stay current with your vaccines (immunizations).  Do not use any tobacco products, such as cigarettes, chewing tobacco, and e-cigarettes. If you need help quitting, ask your health care provider.  Limit alcohol intake to no more than 2 drinks per day. One drink equals 12 ounces of beer, 5 ounces of wine, or 1 ounces of hard liquor.  Do not use street drugs.  Do not share needles.  Ask your health care provider for help if you need support or information about quitting drugs.  Tell your health care provider if you often feel depressed.  Tell your health care provider if you have ever been abused or do not feel safe at home. This information is not intended to replace advice given to you by your health care provider. Make sure you discuss any questions you have with your health care provider. Document Released: 08/01/2007 Document Revised: 10/02/2015 Document Reviewed: 11/06/2014 Elsevier Interactive Patient  Education  2018 [employer]  

## 2018-01-11 ENCOUNTER — Other Ambulatory Visit: Payer: Self-pay | Admitting: Dermatology

## 2018-01-11 DIAGNOSIS — C4492 Squamous cell carcinoma of skin, unspecified: Secondary | ICD-10-CM

## 2018-01-11 HISTORY — DX: Squamous cell carcinoma of skin, unspecified: C44.92

## 2018-03-28 ENCOUNTER — Ambulatory Visit: Payer: Medicare Other | Admitting: Family Medicine

## 2018-03-28 ENCOUNTER — Encounter: Payer: Self-pay | Admitting: Family Medicine

## 2018-03-28 ENCOUNTER — Other Ambulatory Visit: Payer: Self-pay

## 2018-03-28 VITALS — BP 132/74 | HR 89 | Temp 97.7°F | Ht 68.0 in | Wt 190.5 lb

## 2018-03-28 DIAGNOSIS — L659 Nonscarring hair loss, unspecified: Secondary | ICD-10-CM

## 2018-03-28 LAB — TSH: TSH: 2.98 u[IU]/mL (ref 0.35–4.50)

## 2018-03-28 NOTE — Progress Notes (Signed)
  Subjective:     Patient ID: Jose King, male   DOB: [birth year], 78 y.o.   MRN: [id number]  HPI Patient has history of hypertension and hyperlipidemia.  He is here today with concerns of possible hypothyroidism.  He is had some generalized alopecia recently and someone had suggested he get his thyroid rechecked.  Appetite and weight are unchanged.  No cold intolerance.  No constipation.  He did have thyroid checked about 2 years ago which was normal.  His medications include amlodipine and Lipitor.  He has been on both these for several years  Past Medical History:  Diagnosis Date  . Hyperlipidemia   . Macular degeneration    Past Surgical History:  Procedure Laterality Date  . BACK SURGERY  1985  . BASAL CELL CARCINOMA EXCISION  2017  . COLONOSCOPY  05/10/2009   RMR: normal. repeat 5 years  . COLONOSCOPY  2003   RMR: normal  . COLONOSCOPY  1999   RMR: adenomatous ICV  . COLONOSCOPY N/A 05/09/2014   Procedure: COLONOSCOPY;  Surgeon: Daneil Dolin, MD;  Location: [facility];  Service: Endoscopy;  Laterality: N/A;  9:15am  . [place]  . PROSTATE SURGERY    . [place]    reports that he has never smoked. He has never used smokeless tobacco. He reports that he does not drink alcohol or use drugs. family history includes Cancer in some other family members; Cancer (age of onset: 24) in his mother; Cancer (age of onset: 18) in his brother; Heart attack (age of onset: 80) in his father; Lymphoma in his brother; Suicidality in his brother; Vision loss in his paternal uncle. No Known Allergies   Review of Systems  Constitutional: Negative for appetite change and unexpected weight change.  Gastrointestinal: Negative for constipation.  Endocrine: Negative for cold intolerance and heat intolerance.  Skin: Negative for rash.       Objective:   Physical Exam Constitutional:      Appearance: Normal appearance.  Cardiovascular:   Rate and Rhythm: Normal rate and regular rhythm.  Pulmonary:     Effort: Pulmonary effort is normal.     Breath sounds: Normal breath sounds.  Musculoskeletal:     Right lower leg: No edema.     Left lower leg: No edema.  Skin:    Comments: He has some generalized thinning of the hair but no localized alopecia  Neurological:     Mental Status: He is alert.        Assessment:     Mild generalized alopecia of recent onset.  No recent change of medication.  No overt symptoms of hypothyroidism otherwise    Plan:     -Check TSH  Eulas Post MD [facility]

## 2018-08-14 ENCOUNTER — Other Ambulatory Visit: Payer: Self-pay | Admitting: Family Medicine

## 2018-08-17 ENCOUNTER — Ambulatory Visit (INDEPENDENT_AMBULATORY_CARE_PROVIDER_SITE_OTHER): Payer: Medicare Other | Admitting: Family Medicine

## 2018-08-17 ENCOUNTER — Other Ambulatory Visit: Payer: Self-pay

## 2018-08-17 DIAGNOSIS — I10 Essential (primary) hypertension: Secondary | ICD-10-CM

## 2018-08-17 DIAGNOSIS — E785 Hyperlipidemia, unspecified: Secondary | ICD-10-CM | POA: Diagnosis not present

## 2018-08-17 MED ORDER — AMLODIPINE BESYLATE 5 MG PO TABS: 5.0000 mg | ORAL_TABLET | Freq: Every day | ORAL | 3 refills | Status: DC

## 2018-08-17 MED ORDER — ATORVASTATIN CALCIUM 20 MG PO TABS: 20.0000 mg | ORAL_TABLET | Freq: Every day | ORAL | 3 refills | Status: DC

## 2018-08-17 NOTE — Telephone Encounter (Signed)
Patient has a telephone visit today.

## 2018-08-17 NOTE — Progress Notes (Signed)
Patient ID: Jose King, male   DOB: 06/26/1940, 78 y.o.   MRN: [id number]  This visit type was conducted due to national recommendations for restrictions regarding the COVID-19 pandemic in an effort to limit this patient's exposure and mitigate transmission in our community.   Virtual Visit via Telephone Note  I connected with Jose King on 08/17/18 at 10:15 AM EDT by telephone and verified that I am speaking with the correct person using two identifiers.   I discussed the limitations, risks, security and privacy concerns of performing an evaluation and management service by telephone and the availability of in person appointments. I also discussed with the patient that there may be a patient responsible charge related to this service. The patient expressed understanding and agreed to proceed.  Location patient: home Location provider: work or home office Participants present for the call: patient, provider Patient did not have a visit in the prior 7 days to address this/these issue(s).   History of Present Illness: Medical follow-up.  Patient was seen back in February with some alopecia.  We checked his thyroid at that point which was normal.  His hair loss has ceased.  His chronic problems include hypertension and hyperlipidemia.  He remains on Lipitor and amlodipine.  Blood pressures been controlled by home readings.  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  No recent chest pains, dizziness, or headaches.  He has no myalgias with Lipitor.  He is due for follow-up labs.  He wishes to defer at this time.   Observations/Objective: Patient sounds cheerful and well on the phone. I do not appreciate any SOB. Speech and thought processing are grossly intact. Patient reported vitals:  Assessment and Plan:  #1 hypertension stable by home readings  -Refill amlodipine for 1 year  #2 dyslipidemia treated with Lipitor  -Future lab order for lipid and hepatic panel and he  will consider coming in a couple months for that  Follow Up Instructions:  -2 months for labs and consider physical at some point within the next year   [id number] 5-10 [id number] 11-20 [id number] 21-30 I did not refer this patient for an OV in the next 24 hours for this/these issue(s).  I discussed the assessment and treatment plan with the patient. The patient was provided an opportunity to ask questions and all were answered. The patient agreed with the plan and demonstrated an understanding of the instructions.   The patient was advised to call back or seek an in-person evaluation if the symptoms worsen or if the condition fails to improve as anticipated.  I provided 18 minutes of non-face-to-face time during this encounter.   Carolann Littler, MD

## 2018-11-01 ENCOUNTER — Telehealth: Payer: Self-pay

## 2018-11-01 NOTE — Telephone Encounter (Signed)
Please see message. Please add additional labs if needed and let me know so I can call to schedule. Thank you!  Copied from [place] [phone]. Topic: General - Other >> Oct 28, 2018 12:33 PM Rayann Heman wrote: Reason for CRM: pt wife called to schedule blood work but would also like to include labs for prostate gland. Please advise

## 2018-11-02 NOTE — Telephone Encounter (Signed)
I am assuming they mean PSA.   OK to add.

## 2018-11-04 ENCOUNTER — Other Ambulatory Visit: Payer: Self-pay

## 2018-11-04 DIAGNOSIS — Z125 Encounter for screening for malignant neoplasm of prostate: Secondary | ICD-10-CM

## 2018-11-04 NOTE — Telephone Encounter (Signed)
I have added lab, called patient to make aware and he is already scheduled for lab appointment. Patient verbalized an understanding.

## 2018-11-09 ENCOUNTER — Other Ambulatory Visit: Payer: Self-pay

## 2018-11-09 ENCOUNTER — Other Ambulatory Visit: Payer: Self-pay | Admitting: Family Medicine

## 2018-11-09 ENCOUNTER — Other Ambulatory Visit (INDEPENDENT_AMBULATORY_CARE_PROVIDER_SITE_OTHER): Payer: Medicare Other

## 2018-11-09 DIAGNOSIS — Z125 Encounter for screening for malignant neoplasm of prostate: Secondary | ICD-10-CM | POA: Diagnosis not present

## 2018-11-09 DIAGNOSIS — I10 Essential (primary) hypertension: Secondary | ICD-10-CM

## 2018-11-09 DIAGNOSIS — E785 Hyperlipidemia, unspecified: Secondary | ICD-10-CM

## 2018-11-09 DIAGNOSIS — R972 Elevated prostate specific antigen [PSA]: Secondary | ICD-10-CM

## 2018-11-09 LAB — HEPATIC FUNCTION PANEL
ALT: 22 U/L (ref 0–53)
AST: 17 U/L (ref 0–37)
Albumin: 4.2 g/dL (ref 3.5–5.2)
Alkaline Phosphatase: 105 U/L (ref 39–117)
Bilirubin, Direct: 0.1 mg/dL (ref 0.0–0.3)
Total Bilirubin: 0.8 mg/dL (ref 0.2–1.2)
Total Protein: 6.4 g/dL (ref 6.0–8.3)

## 2018-11-09 LAB — BASIC METABOLIC PANEL
BUN: 19 mg/dL (ref 6–23)
CO2: 30 mEq/L (ref 19–32)
Calcium: 9.3 mg/dL (ref 8.4–10.5)
Chloride: 101 mEq/L (ref 96–112)
Creatinine, Ser: 1.01 mg/dL (ref 0.40–1.50)
GFR: 71.42 mL/min (ref >60.00)
Glucose, Bld: 92 mg/dL (ref 70–99)
Potassium: 3.7 mEq/L (ref 3.5–5.1)
Sodium: 140 mEq/L (ref 135–145)

## 2018-11-09 LAB — LIPID PANEL
Cholesterol: 140 mg/dL (ref 0–200)
HDL: 43.3 mg/dL (ref >39.00)
LDL Cholesterol: 68 mg/dL (ref 0–99)
NonHDL: 96.39
Total CHOL/HDL Ratio: 3
Triglycerides: 141 mg/dL (ref 0.0–149.0)
VLDL: 28.2 mg/dL (ref 0.0–40.0)

## 2018-11-09 LAB — PSA: PSA: 3.91 ng/mL (ref 0.10–4.00)

## 2018-12-18 HISTORY — PX: EYE SURGERY: SHX253

## 2018-12-27 ENCOUNTER — Ambulatory Visit: Payer: Medicare Other

## 2018-12-29 IMAGING — DX DG TIBIA/FIBULA 2V*L*
4 series · 4 of 4 positions shown · non-contrast
Comparison: None.

CLINICAL DATA: Trauma to the left lower leg 1 week ago with
bruising

EXAM:
LEFT TIBIA AND FIBULA - 2 VIEW

[tibia ap (1 of 2)]
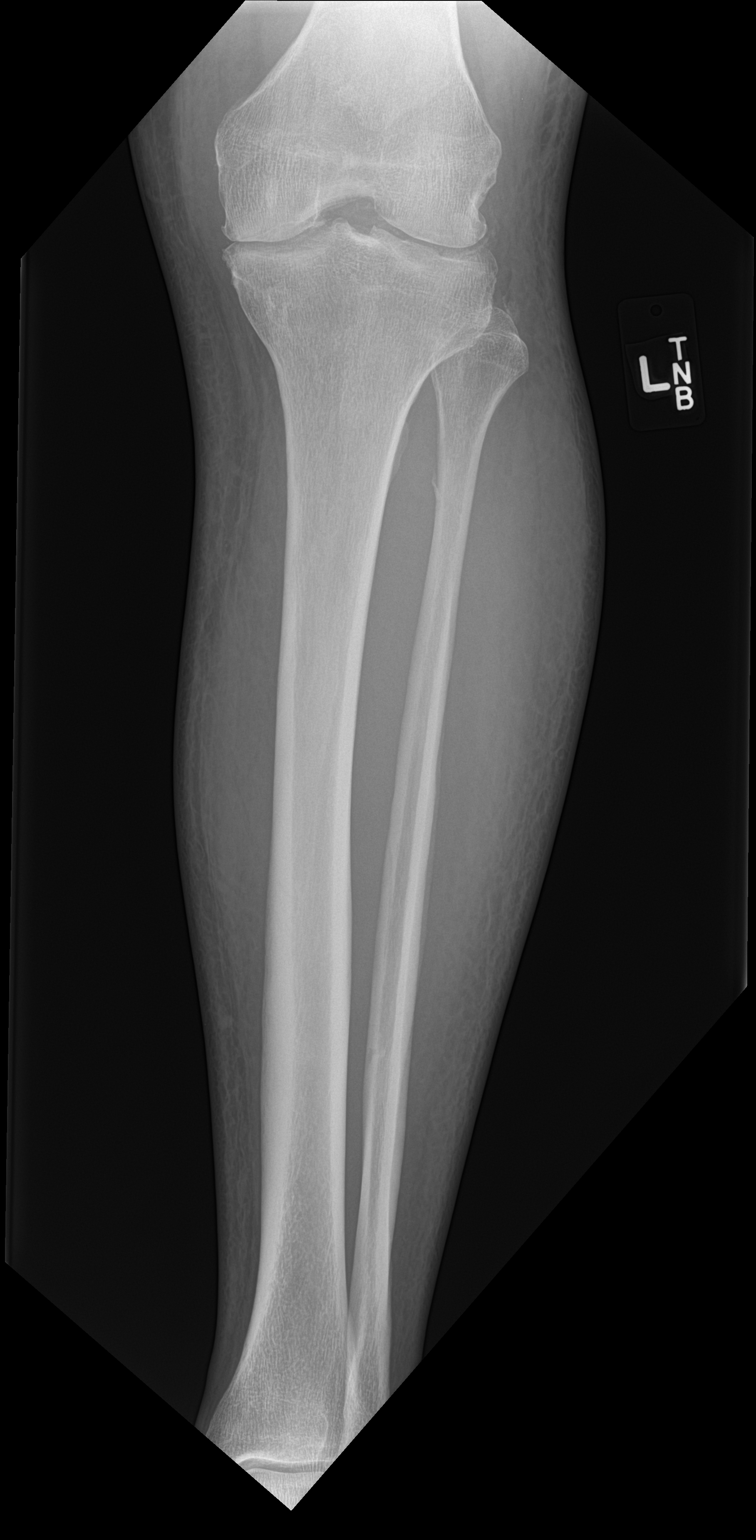

[tibia ap (2 of 2)]
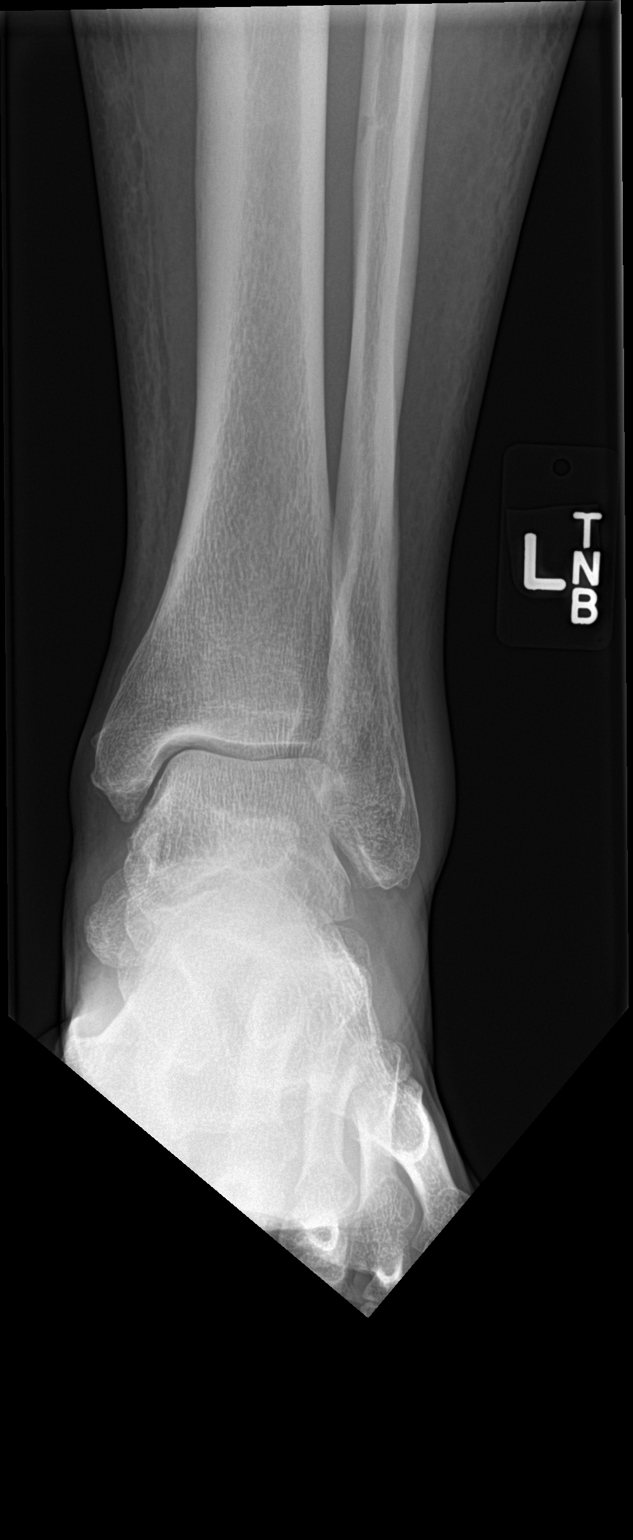

[tibia lat (1 of 2)]
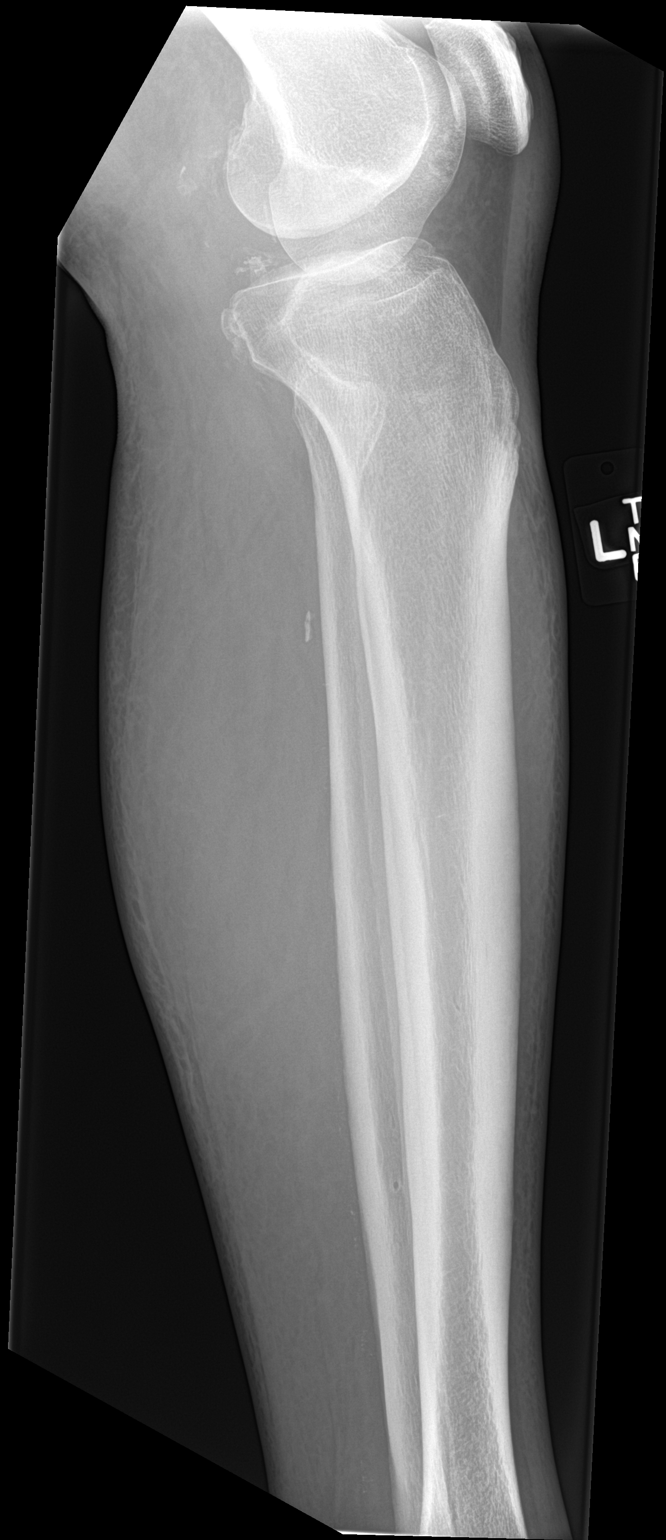

[tibia lat (2 of 2)]
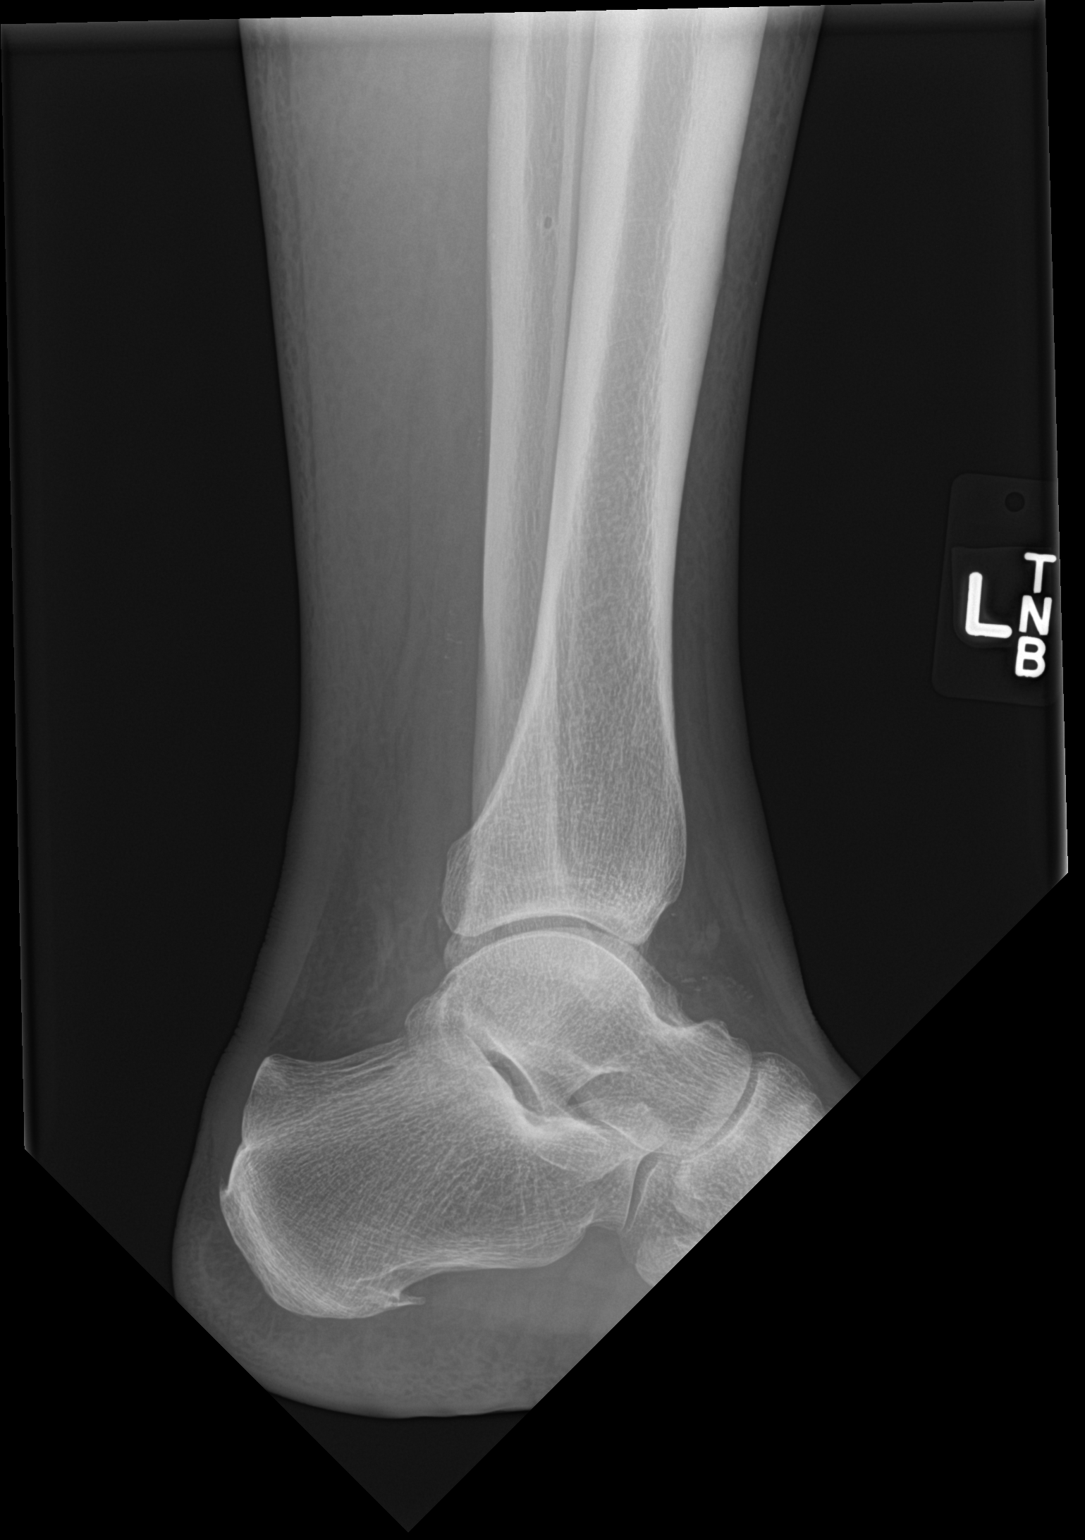

[4 of 4 positions shown; findings below may reference images not displayed]

FINDINGS: There is no evidence of fracture or dislocation. Degenerative joint
changes of left knee are noted. There is mild soft tissue swelling
of the lateral ankle.
IMPRESSION: No acute fracture or dislocation. Mild soft tissue swelling of the
lateral ankle.

## 2019-01-16 ENCOUNTER — Telehealth: Payer: Self-pay

## 2019-01-16 ENCOUNTER — Ambulatory Visit (INDEPENDENT_AMBULATORY_CARE_PROVIDER_SITE_OTHER): Payer: Medicare Other

## 2019-01-16 ENCOUNTER — Other Ambulatory Visit: Payer: Self-pay

## 2019-01-16 VITALS — BP 140/84 | HR 78 | Ht 70.0 in | Wt 185.0 lb

## 2019-01-16 DIAGNOSIS — Z0000 Encounter for general adult medical examination without abnormal findings: Secondary | ICD-10-CM | POA: Diagnosis not present

## 2019-01-16 NOTE — Patient Instructions (Signed)
Jose King , Thank you for taking time to come for your Medicare Wellness Visit. I appreciate your ongoing commitment to your health goals. Please review the following plan we discussed and let me know if I can assist you in the future.   Screening recommendations/referrals: Colorectal Screening: Last 05/09/2014; due again 05/10/2019  Vision and Dental Exams: Recommended annual ophthalmology exams for early detection of glaucoma and other disorders of the eye Recommended annual dental exams for proper oral hygiene  Diabetic Exams: Diabetic Eye Exam: N/A Diabetic Foot Exam: N/A   Vaccinations: Influenza vaccine: Patient reported received September 2020 at pharmacy ; up to date Pneumococcal vaccine: Patient received 12/22/2017; up to date Tdap vaccine: Patient received 06/19/2015; up to date Shingles vaccine: patient reported he received at local pharmacy; information requested  Advanced directives: Advance directives discussed with you today. Patient stated he has a Living Will. Please bring a copy of your POA (Power of [place]) and/or Living Will to your next appointment.  Goals: Recommend to drink at least 6-8 8oz glasses of water per day. Recommend to exercise for at least 150 minutes per week. Recommend to continue low salt diet as directed below  Next appointment: Please schedule your Annual Wellness Visit with your Nurse Health Advisor in one year.  Preventive Care 8 Years and Older, Male Preventive care refers to lifestyle choices and visits with your health care provider that can promote health and wellness. What does preventive care include?  A yearly physical exam. This is also called an annual well check.  Dental exams once or twice a year.  Routine eye exams. Ask your health care provider how often you should have your eyes checked.  Personal lifestyle choices, including:  Daily care of your teeth and gums.  Regular physical activity.  Eating a healthy  diet.  Avoiding tobacco and drug use.  Limiting alcohol use.  Practicing safe sex.  Taking low doses of aspirin every day if recommended by your health care provider..  Taking vitamin and mineral supplements as recommended by your health care provider. What happens during an annual well check? The services and screenings done by your health care provider during your annual well check will depend on your age, overall health, lifestyle risk factors, and family history of disease. Counseling  Your health care provider may ask you questions about your:  Alcohol use.  Tobacco use.  Drug use.  Emotional well-being.  Home and relationship well-being.  Sexual activity.  Eating habits.  History of falls.  Memory and ability to understand (cognition).  Work and work Statistician. Screening  You may have the following tests or measurements:  Height, weight, and BMI.  Blood pressure.  Lipid and cholesterol levels. These may be checked every 5 years, or more frequently if you are over 68 years old.  Skin check.  Lung cancer screening. You may have this screening every year starting at age 8 if you have a 30-pack-year history of smoking and currently smoke or have quit within the past 15 years.  Fecal occult blood test (FOBT) of the stool. You may have this test every year starting at age 69.  Flexible sigmoidoscopy or colonoscopy. You may have a sigmoidoscopy every 5 years or a colonoscopy every 10 years starting at age 78.  Prostate cancer screening. Recommendations will vary depending on your family history and other risks.  Hepatitis C blood test.  Hepatitis B blood test.  Sexually transmitted disease (STD) testing.  Diabetes screening. This is done by  checking your blood sugar (glucose) after you have not eaten for a while (fasting). You may have this done every 1-3 years.  Abdominal aortic aneurysm (AAA) screening. You may need this if you are a current or former  smoker.  Osteoporosis. You may be screened starting at age 78 if you are at high risk. Talk with your health care provider about your test results, treatment options, and if necessary, the need for more tests. Vaccines  Your health care provider may recommend certain vaccines, such as:  Influenza vaccine. This is recommended every year.  Tetanus, diphtheria, and acellular pertussis (Tdap, Td) vaccine. You may need a Td booster every 10 years.  Zoster vaccine. You may need this after age 21.  Pneumococcal 13-valent conjugate (PCV13) vaccine. One dose is recommended after age 58.  Pneumococcal polysaccharide (PPSV23) vaccine. One dose is recommended after age 30. Talk to your health care provider about which screenings and vaccines you need and how often you need them. This information is not intended to replace advice given to you by your health care provider. Make sure you discuss any questions you have with your health care provider. Document Released: 03/01/2015 Document Revised: 10/23/2015 Document Reviewed: 12/04/2014 Elsevier Interactive Patient Education  2017 [employer] Prevention in the Home Falls can cause injuries. They can happen to people of all ages. There are many things you can do to make your home safe and to help prevent falls. What can I do on the outside of my home?  Regularly fix the edges of walkways and driveways and fix any cracks.  Remove anything that might make you trip as you walk through a door, such as a raised step or threshold.  Trim any bushes or trees on the path to your home.  Use bright outdoor lighting.  Clear any walking paths of anything that might make someone trip, such as rocks or tools.  Regularly check to see if handrails are loose or broken. Make sure that both sides of any steps have handrails.  Any raised decks and porches should have guardrails on the edges.  Have any leaves, snow, or ice cleared regularly.  Use sand or  salt on walking paths during winter.  Clean up any spills in your garage right away. This includes oil or grease spills. What can I do in the bathroom?  Use night lights.  Install grab bars by the toilet and in the tub and shower. Do not use towel bars as grab bars.  Use non-skid mats or decals in the tub or shower.  If you need to sit down in the shower, use a plastic, non-slip stool.  Keep the floor dry. Clean up any water that spills on the floor as soon as it happens.  Remove soap buildup in the tub or shower regularly.  Attach bath mats securely with double-sided non-slip rug tape.  Do not have throw rugs and other things on the floor that can make you trip. What can I do in the bedroom?  Use night lights.  Make sure that you have a light by your bed that is easy to reach.  Do not use any sheets or blankets that are too big for your bed. They should not hang down onto the floor.  Have a firm chair that has side arms. You can use this for support while you get dressed.  Do not have throw rugs and other things on the floor that can make you trip. What can I  do in the kitchen?  Clean up any spills right away.  Avoid walking on wet floors.  Keep items that you use a lot in easy-to-reach places.  If you need to reach something above you, use a strong step stool that has a grab bar.  Keep electrical cords out of the way.  Do not use floor polish or wax that makes floors slippery. If you must use wax, use non-skid floor wax.  Do not have throw rugs and other things on the floor that can make you trip. What can I do with my stairs?  Do not leave any items on the stairs.  Make sure that there are handrails on both sides of the stairs and use them. Fix handrails that are broken or loose. Make sure that handrails are as long as the stairways.  Check any carpeting to make sure that it is firmly attached to the stairs. Fix any carpet that is loose or worn.  Avoid having  throw rugs at the top or bottom of the stairs. If you do have throw rugs, attach them to the floor with carpet tape.  Make sure that you have a light switch at the top of the stairs and the bottom of the stairs. If you do not have them, ask someone to add them for you. What else can I do to help prevent falls?  Wear shoes that:  Do not have high heels.  Have rubber bottoms.  Are comfortable and fit you well.  Are closed at the toe. Do not wear sandals.  If you use a stepladder:  Make sure that it is fully opened. Do not climb a closed stepladder.  Make sure that both sides of the stepladder are locked into place.  Ask someone to hold it for you, if possible.  Clearly mark and make sure that you can see:  Any grab bars or handrails.  First and last steps.  Where the edge of each step is.  Use tools that help you move around (mobility aids) if they are needed. These include:  Canes.  Walkers.  Scooters.  Crutches.  Turn on the lights when you go into a dark area. Replace any light bulbs as soon as they burn out.  Set up your furniture so you have a clear path. Avoid moving your furniture around.  If any of your floors are uneven, fix them.  If there are any pets around you, be aware of where they are.  Review your medicines with your doctor. Some medicines can make you feel dizzy. This can increase your chance of falling. Ask your doctor what other things that you can do to help prevent falls. This information is not intended to replace advice given to you by your health care provider. Make sure you discuss any questions you have with your health care provider. Document Released: 11/29/2008 Document Revised: 07/11/2015 Document Reviewed: 03/09/2014 Elsevier Interactive Patient Education  2017 [employer].

## 2019-01-16 NOTE — Telephone Encounter (Signed)
Please see messages.

## 2019-01-16 NOTE — Telephone Encounter (Signed)
Please let Jose King know.  We need to have someone with IT make sure her computer is linked with our printer.

## 2019-01-16 NOTE — Telephone Encounter (Signed)
Patient had an [place] visit today with [facility]. Sending as an Pharmacist, hospital per message.  Copied from [place] [phone]. Topic: General - Other >> Jan 16, 2019 11:50 AM Yvette Rack wrote: Reason for CRM: Bridgette with [facility] stated that this patient's after visit summary was printed and sent to their location. Bridgette stated she will shred the paperwork but she wanted the provider to be aware.

## 2019-01-16 NOTE — Progress Notes (Addendum)
This visit is being conducted via phone call due to the COVID-19 pandemic. This patient has given me verbal consent via phone to conduct this visit, patient states they are participating from their home address. Some vital signs may be absent or patient reported.   Patient identification: identified by name, DOB, and current address.  Location provider: [facility], Office Persons participating in the virtual visit: Jose King, Jose Forts, LPN, and Jose King   Subjective:   Jose King is a 78 y.o. male who presents for Medicare Annual/Subsequent preventive examination.  Review of Systems:   Cardiac Risk Factors include: advanced age (>65men, >74 women);dyslipidemia;hypertension;male gender     Objective:    Vitals: BP 140/84 Comment: patient reported  Pulse 78   Ht 5\' 10"  (1.778 m)   Wt 185 lb (83.9 kg)   BMI 26.54 kg/m   Body mass index is 26.54 kg/m.  Advanced Directives 01/16/2019 03/08/2017 06/19/2016 05/09/2014  Does Patient Have a Medical Advance Directive? Yes No No No;Yes  Type of Advance Directive Living will - - Living will    Tobacco Social History   Tobacco Use  Smoking Status Never Smoker  Smokeless Tobacco Never Used     Counseling given: Not Answered   Clinical Intake:  Pre-visit preparation completed: No  Pain : No/denies pain     Nutritional Status: BMI 25 -29 Overweight Nutritional Risks: None Diabetes: No  How often do you need to have someone help you when you read instructions, pamphlets, or other written materials from your doctor or pharmacy?: 1 - Never  Interpreter Needed?: No  Information entered by :: Jose Forts, LPN  Past Medical History:  Diagnosis Date  . Atypical nevus 09/04/2008   Right Dorsal Foot - Slight to Moderate, and Left Upper Back - Slight to Moderate  . 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 Hyperlipidemia   . Macular degeneration   . 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 Superficial basal cell carcinoma (BCC) 08/23/2002   V of Neck   Past Surgical History:  Procedure Laterality Date  . BACK SURGERY  1985  . BASAL CELL CARCINOMA EXCISION  2017  . COLONOSCOPY  05/10/2009   RMR: normal. repeat 5 years  . COLONOSCOPY  2003   RMR: normal  . COLONOSCOPY  1999   RMR: adenomatous ICV  . COLONOSCOPY N/A 05/09/2014   Procedure: COLONOSCOPY;  Surgeon: Daneil Dolin, MD;  Location[facility];  Service: Endoscopy;  Laterality: N/A;  9:15am  . EYE SURGERY  12/2018   cataract   . INGUINAL HERNIA REPAIR  1985  . PROSTATE SURGERY    . TONSILLECTOMY AND ADENOIDECTOMY  1948   Family History  Problem Relation Age of Onset  . Cancer Mother 80       colon  . Heart attack Father 65  . Suicidality Brother   . Cancer Brother 14       Pancreatic  . Lymphoma Brother   . Cancer Other        grandfather, mouth  . Cancer Other  grandmother, intestional   . Vision loss Paternal Uncle        Massive Stroke   Social History   Socioeconomic History  . Marital status: Married    Spouse name: Not on file  . Number of children: 0  . Years of education: Not on file  . Highest education level: Not on file  Occupational History  . Occupation: Retired  Scientific laboratory technician  . Financial resource strain: Not on file  . Food insecurity    Worry: Not on file    Inability: Not on file  . Transportation needs    Medical: Not on file    Non-medical: Not on file  Tobacco Use  . Smoking status: Never Smoker  . Smokeless tobacco: Never Used  Substance and Sexual Activity  . Alcohol use: No  . Drug use: No  . Sexual activity: Not on file   Lifestyle  . Physical activity    Days per week: 6 days    Minutes per session: Not on file  . Stress: Not at all  Relationships  . Social Herbalist on phone: Not on file    Gets together: Not on file    Attends religious service: Not on file    Active member of club or organization: Not on file    Attends meetings of clubs or organizations: Not on file    Relationship status: Not on file  Other Topics Concern  . Not on file  Social History Narrative   Married   Gets regular exercise from farm work    Outpatient Encounter Medications as of 01/16/2019  Medication Sig  . amLODipine (NORVASC) 5 MG tablet Take 1 tablet (5 mg total) by mouth daily.  Marland Kitchen aspirin 81 MG tablet Take 81 mg by mouth daily.    Marland Kitchen atorvastatin (LIPITOR) 20 MG tablet Take 1 tablet (20 mg total) by mouth daily.  . calcium-vitamin D (OSCAL) 250-125 MG-UNIT per tablet Take 1 tablet by mouth 3 (three) times a week.   . Multiple Vitamins-Minerals (PRESERVISION AREDS PO) Take 1 tablet by mouth daily.  . Multiple Vitamins-Minerals (OCUVITE PRESERVISION) TABS Take 1 tablet by mouth 2 (two) times daily.    No facility-administered encounter medications on file as of 01/16/2019.     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 N  Difficulty concentrating or making decisions? N  Walking or climbing stairs? N  Dressing or bathing? N  Doing errands, shopping? N  Preparing Food and eating ? N  Using the Toilet? N  In the past six months, have you accidently leaked urine? N  Do you have problems with loss of bowel control? N  Managing your Medications? N  Managing your Finances? N  Housekeeping or managing your Housekeeping? N  Some recent data might be hidden    Patient Care Team: Eulas Post, MD as PCP - General (Family Medicine) Gala Romney Cristopher Estimable, MD as  Consulting Physician (Gastroenterology)   Assessment:   This is a routine wellness examination for Jose King.  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 DIET - REDUCE SUGAR INTAKE     Cut back on sweets; jellies; cake and pies      . Patient Stated  01/16/2019 need for developing a routine exercise plan and decreasing intake of sweets.     . Reduce sugar intake to X grams per day     Cut back on sweets and eat more         Fall Risk Fall Risk  01/16/2019 12/22/2017 07/14/2017 06/19/2016 06/19/2015  Falls in the past year? 0 0 No No No  Risk for fall due to : Medication side effect - - - -  Follow up Falls evaluation completed;Education provided;Falls prevention discussed - - - -   Is the patient's home free of loose throw rugs in walkways, pet beds, electrical cords, etc?   yes      Grab bars in the bathroom? yes      Handrails on the stairs?   yes      Adequate lighting?   yes  Timed Get Up and Go Performed: N/A due to telephone visit  Depression Screen PHQ 2/9 Scores 01/16/2019 12/22/2017 07/14/2017 06/19/2016  PHQ - 2 Score 0 0 0 0    Cognitive Function MMSE - Mini Mental State Exam 12/22/2017 06/19/2016  Not completed: (No Data) (No Data)     6CIT Screen 06/19/2016  What Year? 0 points  What month? 0 points  What time? 0 points  Count back from 20 0 points  Months in reverse 0 points  Repeat phrase 0 points  Total Score 0    Immunization History  Administered Date(s) Administered  . Influenza, High Dose Seasonal PF 11/18/2017, 11/02/2018  . Influenza-Unspecified 11/14/2016  . Tdap 06/19/2015    Qualifies for Shingles Vaccine? Patient states he has previously received this vaccine.   Screening Tests Health Maintenance  Topic Date Due  . INFLUENZA VACCINE  09/17/2018  . TETANUS/TDAP  06/18/2025  . PNA vac Low Risk Adult   Completed   Cancer Screenings: Lung: Low Dose CT Chest recommended if Age 93-80 years, 30 pack-year currently smoking OR have quit w/in 15years. Patient does not qualify. Colorectal: Last one performed 05/09/2014; due again 05/10/2019 with Dr. 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 Encouraged decreasing oral intake of sweets. Recommend a diet rich in fresh fruits and vegetables. Patient to bring a copy of living will to office. No concerns at this time with cognitive function, therefore, cognitive screening was not performed today.   I have personally reviewed and noted the following in the patient's chart:   . Medical and social history . Use of alcohol, tobacco or illicit drugs  . Current medications and supplements . Functional ability and status . Nutritional status . Physical activity . Advanced directives . List of other physicians . Hospitalizations, surgeries, and ER visits in previous 12 months . Vitals . Screenings to include cognitive, depression, and falls . Referrals and appointments  In addition, I have reviewed and discussed with patient certain preventive protocols, quality metrics, and best practice recommendations. A written personalized care plan for preventive services as well as general preventive health recommendations were provided to patient.     Jose Forts, LPN  [id number]

## 2019-01-16 NOTE — Telephone Encounter (Signed)
Printing problem addressed and corrected.

## 2019-02-14 ENCOUNTER — Other Ambulatory Visit: Payer: Self-pay | Admitting: Dermatology

## 2019-02-28 DIAGNOSIS — H43813 Vitreous degeneration, bilateral: Secondary | ICD-10-CM | POA: Diagnosis not present

## 2019-02-28 DIAGNOSIS — H35373 Puckering of macula, bilateral: Secondary | ICD-10-CM | POA: Diagnosis not present

## 2019-02-28 DIAGNOSIS — H353132 Nonexudative age-related macular degeneration, bilateral, intermediate dry stage: Secondary | ICD-10-CM | POA: Diagnosis not present

## 2019-02-28 DIAGNOSIS — H40013 Open angle with borderline findings, low risk, bilateral: Secondary | ICD-10-CM | POA: Diagnosis not present

## 2019-02-28 DIAGNOSIS — Z961 Presence of intraocular lens: Secondary | ICD-10-CM | POA: Diagnosis not present

## 2019-02-28 DIAGNOSIS — Z9842 Cataract extraction status, left eye: Secondary | ICD-10-CM | POA: Diagnosis not present

## 2019-02-28 DIAGNOSIS — Z9841 Cataract extraction status, right eye: Secondary | ICD-10-CM | POA: Diagnosis not present

## 2019-04-03 ENCOUNTER — Encounter: Payer: Self-pay | Admitting: Internal Medicine

## 2019-04-28 ENCOUNTER — Other Ambulatory Visit: Payer: Self-pay

## 2019-05-01 ENCOUNTER — Other Ambulatory Visit: Payer: Self-pay

## 2019-05-01 ENCOUNTER — Other Ambulatory Visit (INDEPENDENT_AMBULATORY_CARE_PROVIDER_SITE_OTHER): Payer: Medicare PPO

## 2019-05-01 DIAGNOSIS — R972 Elevated prostate specific antigen [PSA]: Secondary | ICD-10-CM

## 2019-05-01 LAB — PSA: PSA: 3.85 ng/mL (ref 0.10–4.00)

## 2019-05-02 ENCOUNTER — Ambulatory Visit (INDEPENDENT_AMBULATORY_CARE_PROVIDER_SITE_OTHER): Payer: Medicare PPO | Admitting: Internal Medicine

## 2019-05-02 ENCOUNTER — Encounter: Payer: Self-pay | Admitting: Internal Medicine

## 2019-05-02 ENCOUNTER — Other Ambulatory Visit: Payer: Self-pay

## 2019-05-02 VITALS — BP 162/92 | HR 87 | Temp 97.3°F | Ht 70.0 in | Wt 191.0 lb

## 2019-05-02 DIAGNOSIS — Z86010 Personal history of colonic polyps: Secondary | ICD-10-CM | POA: Diagnosis not present

## 2019-05-02 MED ORDER — PEG 3350-KCL-NA BICARB-NACL 420 G PO SOLR: 4000.0000 mL | ORAL | 0 refills | Status: DC

## 2019-05-02 NOTE — Patient Instructions (Signed)
Schedule a surveillance colonoscopy (hx of colon polyps and family history of colon cancer) - conscious sedation  Further recommendations to follow

## 2019-05-02 NOTE — Patient Instructions (Signed)
PA for TCS approved via [employer]. [employer]# [id number], valid 06/09/19-07/09/19.

## 2019-05-02 NOTE — Progress Notes (Signed)
Primary Care Physician:  Eulas Post, MD Primary Gastroenterologist:  Dr. Gala Romney  Pre-Procedure History & Physical: HPI:  Jose King is a 79 y.o. male here for follow-up.  History of colonic polyps and positive family history colon cancer in first-degree relative.  Jose King is doing very well.  Not having any symptoms aside from occasional constipation - no bleeding.  He is able to colonoscopies in the past with 5 mm adenoma removed from his colon 5 years ago. He is 78.  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  He recently had Lasik and cataract surgery.  He does have macular degeneration. He is here to discuss the possibility of 1 more surveillance colonoscopy.  Past Medical History:  Diagnosis Date  . Atypical nevus 09/04/2008   Right Dorsal Foot - Slight to Moderate, and Left Upper Back - Slight to Moderate  . 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 Hyperlipidemia   . Macular degeneration   . 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 Superficial basal cell carcinoma (BCC) 08/23/2002   V of Neck    Past Surgical History:  Procedure Laterality Date  . BACK SURGERY  1985  . BASAL CELL CARCINOMA EXCISION  2017  . COLONOSCOPY  05/10/2009   RMR: normal. repeat 5 years  . COLONOSCOPY  2003   RMR: normal  . COLONOSCOPY  1999   RMR: adenomatous ICV  . COLONOSCOPY N/A 05/09/2014   Procedure: COLONOSCOPY;  Surgeon: Daneil Dolin, MD;  Location: [facility];  Service: Endoscopy;  Laterality: N/A;  9:15am  . EYE SURGERY  12/2018   cataract   . INGUINAL HERNIA REPAIR  1985  . PROSTATE SURGERY    .  [place]    Prior to Admission medications   Medication Sig Start Date End Date Taking? Authorizing Provider  amLODipine (NORVASC) 5 MG tablet Take 1 tablet (5 mg total) by mouth daily. 08/17/18  Yes Burchette, Alinda Sierras, MD  aspirin 81 MG tablet Take 81 mg by mouth daily.     Yes [provider]  atorvastatin (LIPITOR) 20 MG tablet Take 1 tablet (20 mg total) by mouth daily. 08/17/18  Yes Burchette, Alinda Sierras, MD  calcium-vitamin D (OSCAL) 250-125 MG-UNIT per tablet Take 1 tablet by mouth 3 (three) times a week.    Yes [provider]  Multiple Vitamins-Minerals (OCUVITE PRESERVISION) TABS Take 1 tablet by mouth daily.    Yes [provider]  Multiple Vitamins-Minerals (PRESERVISION AREDS PO) Take 1 tablet by mouth daily.    Yes [provider]    Allergies as of 05/02/2019  . (No Known Allergies)    Family History  Problem Relation Age of Onset  . Cancer Mother 86       colon  . Heart attack Father 38  . Suicidality Brother   . Cancer Brother 2       Pancreatic  . Lymphoma Brother   . Cancer Other        grandfather, mouth  . Cancer  Other        grandmother, intestional   . Vision loss Paternal Uncle        Massive Stroke    Social History   Socioeconomic History  . Marital status: Married    Spouse name: Not on file  . Number of children: 0  . Years of education: Not on file  . Highest education level: Not on file  Occupational History  . Occupation: Retired  Tobacco Use  . Smoking status: Never Smoker  . Smokeless tobacco: Never Used  Substance and Sexual Activity  . Alcohol use: No  . Drug use: No  . Sexual activity: Not on file  Other Topics Concern  . Not on file  Social History Narrative   Married   Gets regular exercise from farm work   Social Determinants of Radio broadcast assistant Strain:   . Difficulty of Paying Living Expenses:   Food Insecurity:   . Worried About Sales executive in the Last Year:   . Arboriculturist in the Last Year:   Transportation Needs:   . Film/video editor (Medical):   Marland Kitchen Lack of Transportation (Non-Medical):   Physical Activity: Unknown  . Days of Exercise per Week: 6 days  . Minutes of Exercise per Session: Not on file  Stress: No Stress Concern Present  . Feeling of Stress : Not at all  Social Connections:   . Frequency of Communication with Friends and Family:   . Frequency of Social Gatherings with Friends and Family:   . Attends Religious Services:   . Active Member of Clubs or Organizations:   . Attends Archivist Meetings:   Marland Kitchen Marital Status:   Intimate Partner Violence:   . Fear of Current or Ex-Partner:   . Emotionally Abused:   Marland Kitchen Physically Abused:   . Sexually Abused:     Review of Systems: See HPI, otherwise negative ROS  Physical Exam: BP (!) 162/92   Pulse 87   Temp (!) 97.3 F (36.3 C) (Temporal)   Ht 5\' 10"  (1.778 m)   Wt 191 lb (86.6 kg)   BMI 27.41 kg/m  General:   Alert,  Well-developed, well-nourished, pleasant and cooperative in NAD Neck:  Supple; no masses or thyromegaly. No significant cervical adenopathy. Lungs:  Clear throughout to auscultation.   No wheezes, crackles, or rhonchi. No acute distress. Heart:  Regular rate and rhythm; no murmurs, clicks, rubs,  or gallops. Abdomen: Non-distended, normal bowel sounds.  Soft and nontender without appreciable mass or hepatosplenomegaly.  Pulses:  Normal pulses noted. Extremities:  Without clubbing or edema.  Impression/Plan: Pleasant 79 year old gentleman with a positive family history of colon cancer first-degree relative at a young age as well as a personal history of multiple colonic adenomas removed over time.  We discussed the pros and cons of 1 more surveillance colonoscopy relation to his age.  Given the fact that he has a good quality of life he has several more years ahead of him.  We mutually agreed to proceed with 1 more  colonoscopy for surveillance purposes. I have offered the patient a surveillance colonoscopy in the near future utilizing conscious sedation.  The risks, benefits, limitations, alternatives and imponderables have been reviewed with the patient. Questions have been answered. All parties are agreeable.      Notice: This dictation was prepared with Dragon dictation along with smaller phrase technology. Any transcriptional errors that result from this process are unintentional and  may not be corrected upon review.

## 2019-05-08 DIAGNOSIS — H26492 Other secondary cataract, left eye: Secondary | ICD-10-CM | POA: Diagnosis not present

## 2019-06-06 ENCOUNTER — Other Ambulatory Visit (HOSPITAL_COMMUNITY)
Admission: RE | Admit: 2019-06-06 | Discharge: 2019-06-06 | Disposition: A | Discharge status: Home / Self Care | Payer: Medicare PPO | Source: Ambulatory Visit | Attending: Internal Medicine | Admitting: Internal Medicine

## 2019-06-06 ENCOUNTER — Other Ambulatory Visit: Payer: Self-pay

## 2019-06-06 DIAGNOSIS — Z01812 Encounter for preprocedural laboratory examination: Secondary | ICD-10-CM | POA: Diagnosis not present

## 2019-06-06 DIAGNOSIS — Z20822 Contact with and (suspected) exposure to covid-19: Secondary | ICD-10-CM | POA: Insufficient documentation

## 2019-06-07 LAB — SARS CORONAVIRUS 2 (TAT 6-24 HRS): SARS Coronavirus 2: NEGATIVE

## 2019-06-09 ENCOUNTER — Ambulatory Visit (HOSPITAL_COMMUNITY)
Admission: RE | Admit: 2019-06-09 | Discharge: 2019-06-09 | Disposition: A | Discharge status: Home / Self Care | Payer: Medicare PPO | Attending: Internal Medicine | Admitting: Internal Medicine

## 2019-06-09 ENCOUNTER — Other Ambulatory Visit: Payer: Self-pay

## 2019-06-09 ENCOUNTER — Encounter (HOSPITAL_COMMUNITY): Payer: Self-pay | Admitting: Internal Medicine

## 2019-06-09 ENCOUNTER — Encounter (HOSPITAL_COMMUNITY)
Admission: RE | Disposition: A | Discharge status: Home / Self Care | Payer: Self-pay | Source: Home / Self Care | Attending: Internal Medicine

## 2019-06-09 DIAGNOSIS — K635 Polyp of colon: Secondary | ICD-10-CM

## 2019-06-09 DIAGNOSIS — I10 Essential (primary) hypertension: Secondary | ICD-10-CM | POA: Diagnosis not present

## 2019-06-09 DIAGNOSIS — D124 Benign neoplasm of descending colon: Secondary | ICD-10-CM | POA: Diagnosis not present

## 2019-06-09 DIAGNOSIS — Z86010 Personal history of colonic polyps: Secondary | ICD-10-CM | POA: Diagnosis not present

## 2019-06-09 DIAGNOSIS — Z79899 Other long term (current) drug therapy: Secondary | ICD-10-CM | POA: Insufficient documentation

## 2019-06-09 DIAGNOSIS — Q438 Other specified congenital malformations of intestine: Secondary | ICD-10-CM | POA: Diagnosis not present

## 2019-06-09 DIAGNOSIS — Z7982 Long term (current) use of aspirin: Secondary | ICD-10-CM | POA: Insufficient documentation

## 2019-06-09 DIAGNOSIS — Z1211 Encounter for screening for malignant neoplasm of colon: Secondary | ICD-10-CM | POA: Insufficient documentation

## 2019-06-09 DIAGNOSIS — E785 Hyperlipidemia, unspecified: Secondary | ICD-10-CM | POA: Insufficient documentation

## 2019-06-09 DIAGNOSIS — Z800 Family history of malignant neoplasm of digestive organs: Secondary | ICD-10-CM | POA: Diagnosis not present

## 2019-06-09 DIAGNOSIS — Z85828 Personal history of other malignant neoplasm of skin: Secondary | ICD-10-CM | POA: Insufficient documentation

## 2019-06-09 HISTORY — DX: Essential (primary) hypertension: I10

## 2019-06-09 HISTORY — PX: COLONOSCOPY: SHX5424

## 2019-06-09 HISTORY — PX: POLYPECTOMY: SHX5525

## 2019-06-09 SURGERY — COLONOSCOPY
Anesthesia: Moderate Sedation

## 2019-06-09 MED ORDER — SODIUM CHLORIDE 0.9 % IV SOLN: INTRAVENOUS | Status: DC

## 2019-06-09 MED ORDER — MIDAZOLAM HCL 5 MG/5ML IJ SOLN
INTRAMUSCULAR | Status: AC
  Filled 2019-06-09: qty 10

## 2019-06-09 MED ORDER — MIDAZOLAM HCL 5 MG/5ML IJ SOLN
INTRAMUSCULAR | Status: DC | PRN
  Administered 2019-06-09: 2 mg via INTRAVENOUS
  Administered 2019-06-09 (×3): 1 mg via INTRAVENOUS

## 2019-06-09 MED ORDER — MEPERIDINE HCL 50 MG/ML IJ SOLN
INTRAMUSCULAR | Status: AC
  Filled 2019-06-09: qty 1

## 2019-06-09 MED ORDER — MEPERIDINE HCL 100 MG/ML IJ SOLN
INTRAMUSCULAR | Status: DC | PRN
  Administered 2019-06-09: 15 mg via INTRAVENOUS
  Administered 2019-06-09: 25 mg via INTRAVENOUS

## 2019-06-09 MED ORDER — ONDANSETRON HCL 4 MG/2ML IJ SOLN
INTRAMUSCULAR | Status: AC
  Filled 2019-06-09: qty 2

## 2019-06-09 MED ORDER — STERILE WATER FOR IRRIGATION IR SOLN
Status: DC | PRN
  Administered 2019-06-09: 10:00:00 1.5 mL

## 2019-06-09 MED ORDER — ONDANSETRON HCL 4 MG/2ML IJ SOLN
INTRAMUSCULAR | Status: DC | PRN
  Administered 2019-06-09: 4 mg via INTRAVENOUS

## 2019-06-09 NOTE — H&P (Signed)
@LOGO @   Primary Care Physician:  Eulas Post, MD Primary Gastroenterologist:  Dr. Gala Romney  Pre-Procedure History & Physical: HPI:  Jose King is a 79 y.o. male here for surveillance colonoscopy.  History of colonic adenoma and a positive family history of colon cancer.  Patient is 41.  Lengthy discussion previously regarding the pros and cons of 1 more colonoscopy given his age.  All things considered, it is not unreasonable.  Past Medical History:  Diagnosis Date  . Atypical nevus 09/04/2008   Right Dorsal Foot - Slight to Moderate, and Left Upper Back - Slight to Moderate  . 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 Hyperlipidemia   . Hypertension   . Macular degeneration   . 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 Superficial basal cell carcinoma (BCC) 08/23/2002   V of Neck    Past Surgical History:  Procedure Laterality Date  . BACK SURGERY  1985  . BASAL CELL CARCINOMA EXCISION  2017  . COLONOSCOPY  05/10/2009   RMR: normal. repeat 5 years  . COLONOSCOPY  2003   RMR: normal  . COLONOSCOPY  1999   RMR: adenomatous ICV  . COLONOSCOPY N/A 05/09/2014   Procedure: COLONOSCOPY;  Surgeon: Daneil Dolin, MD;  Location: [facility];  Service: Endoscopy;  Laterality: N/A;  9:15am  . EYE SURGERY  12/2018   cataract   . INGUINAL HERNIA REPAIR  1985  . PROSTATE SURGERY    . [place]    Prior to Admission medications   Medication Sig Start Date End Date Taking? Authorizing Provider  amLODipine (NORVASC) 5 MG tablet Take 1 tablet (5 mg total) by mouth daily. 08/17/18  Yes Burchette, Alinda Sierras, MD  aspirin 81 MG tablet  Take 81 mg by mouth daily.     Yes [provider]  atorvastatin (LIPITOR) 20 MG tablet Take 1 tablet (20 mg total) by mouth daily. 08/17/18  Yes Burchette, Alinda Sierras, MD  calcium carbonate (OS-CAL - DOSED IN MG OF ELEMENTAL CALCIUM) 1250 (500 Ca) MG tablet Take 1 tablet by mouth 3 (three) times a week.   Yes [provider]  Multiple Vitamins-Minerals (PRESERVISION AREDS PO) Take 1 tablet by mouth in the morning and at bedtime.    Yes [provider]  polyethylene glycol-electrolytes (TRILYTE) 420 g solution Take 4,000 mLs by mouth as directed. 05/02/19  Yes Daneil Dolin, MD    Allergies as of 05/02/2019  . (No Known Allergies)    Family History  Problem Relation Age of Onset  . Cancer Mother 8       colon  . Heart attack Father 39  . Suicidality Brother   . Cancer Brother 79       Pancreatic  . Lymphoma Brother   . Cancer Other        grandfather, mouth  . Cancer Other        grandmother, intestional   . Vision loss Paternal Uncle        Massive Stroke    Social History   Socioeconomic History  . Marital status:  Married    Spouse name: Not on file  . Number of children: 0  . Years of education: Not on file  . Highest education level: Not on file  Occupational History  . Occupation: Retired  Tobacco Use  . Smoking status: Never Smoker  . Smokeless tobacco: Never Used  Substance and Sexual Activity  . Alcohol use: No  . Drug use: No  . Sexual activity: Not on file  Other Topics Concern  . Not on file  Social History Narrative   Married   Gets regular exercise from farm work   Social Determinants of Radio broadcast assistant Strain:   . Difficulty of Paying Living Expenses:   Food Insecurity:   . Worried About Charity fundraiser in the Last Year:   . Arboriculturist in the Last Year:   Transportation Needs:   . Film/video editor (Medical):   Marland Kitchen Lack of Transportation (Non-Medical):   Physical Activity: Unknown  . Days of  Exercise per Week: 6 days  . Minutes of Exercise per Session: Not on file  Stress: No Stress Concern Present  . Feeling of Stress : Not at all  Social Connections:   . Frequency of Communication with Friends and Family:   . Frequency of Social Gatherings with Friends and Family:   . Attends Religious Services:   . Active Member of Clubs or Organizations:   . Attends Archivist Meetings:   Marland Kitchen Marital Status:   Intimate Partner Violence:   . Fear of Current or Ex-Partner:   . Emotionally Abused:   Marland Kitchen Physically Abused:   . Sexually Abused:     Review of Systems: See HPI, otherwise negative ROS  Physical Exam: BP (!) 155/97   Pulse 67   Temp 97.8 F (36.6 C) (Oral)   Resp 10   Ht 5\' 10"  (1.778 m)   Wt 83.9 kg   SpO2 97%   BMI 26.54 kg/m  General:   Alert,  Well-developed, well-nourished, pleasant and cooperative in NAD Neck:  Supple; no masses or thyromegaly. No significant cervical adenopathy. Lungs:  Clear throughout to auscultation.   No wheezes, crackles, or rhonchi. No acute distress. Heart:  Regular rate and rhythm; no murmurs, clicks, rubs,  or gallops. Abdomen: Non-distended, normal bowel sounds.  Soft and nontender without appreciable mass or hepatosplenomegaly.  Pulses:  Normal pulses noted. Extremities:  Without clubbing or edema.  Impression/Plan: 79 year old gent with a personal history colonic adenoma and a positive family history of colon cancer. Surveillance colonoscopy today per plan. The risks, benefits, limitations, alternatives and imponderables have been reviewed with the patient. Questions have been answered. All parties are agreeable.      Notice: This dictation was prepared with Dragon dictation along with smaller phrase technology. Any transcriptional errors that result from this process are unintentional and may not be corrected upon review.

## 2019-06-09 NOTE — Op Note (Signed)
[facility] Patient Name: Jose King Procedure Date: 06/09/2019 8:26 AM MRN: [id number] Date of Birth: Aug 08, 1940 Attending MD: Norvel Richards , MD CSN: [id number] Age: 79 Admit Type: Outpatient Procedure:                Colonoscopy Indications:              High risk colon cancer surveillance: Personal                            history of colonic polyps Providers:                Norvel Richards, MD, Otis Peak B. Sharon Seller, RN,                            Raphael Gibney, Technician Referring MD:              Medicines:                Midazolam 5 mg IV, Meperidine 40 mg IV Complications:            No immediate complications. Estimated Blood Loss:     Estimated blood loss was minimal. Procedure:                Pre-Anesthesia Assessment:                           - Prior to the procedure, a History and Physical                            was performed, and patient medications and                            allergies were reviewed. The patient's tolerance of                            previous anesthesia was also reviewed. The risks                            and benefits of the procedure and the sedation                            options and risks were discussed with the patient.                            All questions were answered, and informed consent                            was obtained. Prior Anticoagulants: The patient has                            taken no previous anticoagulant or antiplatelet                            agents. ASA Grade Assessment: II - A patient with  mild systemic disease. After reviewing the risks                            and benefits, the patient was deemed in                            satisfactory condition to undergo the procedure.                           After obtaining informed consent, the colonoscope                            was passed under direct vision. Throughout the   procedure, the patient's blood pressure, pulse, and                            oxygen saturations were monitored continuously. The                            [id number]-[id number] scope was introduced through                            the anus and advanced to the the cecum, identified                            by appendiceal orifice and ileocecal valve. The                            colonoscopy was performed without difficulty. The                            patient tolerated the procedure well. The quality                            of the bowel preparation was adequate. Scope In: 10:29:47 AM Scope Out: 10:45:33 AM Scope Withdrawal Time: 0 hours 9 minutes 59 seconds  Total Procedure Duration: 0 hours 15 minutes 46 seconds  Findings:      The perianal and digital rectal examinations were normal.      A 5 mm polyp was found in the descending colon. The polyp was sessile.       The polyp was removed with a cold snare. Resection and retrieval were       complete. Estimated blood loss was minimal.      The exam was otherwise without abnormality on direct and retroflexion       views. Impression:               - One 5 mm polyp in the descending colon, removed                            with a cold snare. Resected and retrieved.                            Redundant colon.                           -  The examination was otherwise normal on direct                            and retroflexion views. Moderate Sedation:      Moderate (conscious) sedation was personally administered by an       anesthesia professional. The following parameters were monitored: oxygen       saturation, heart rate, blood pressure, and response to care. Total       physician intraservice time was 20 minutes. Recommendation:           - Patient has a contact number available for                            emergencies. The signs and symptoms of potential                            delayed complications were discussed  with the                            patient. Return to normal activities tomorrow.                            Written discharge instructions were provided to the                            patient.                           - Resume previous diet.                           - Continue present medications.                           - Repeat colonoscopy date to be determined after                            pending pathology results are reviewed for                            surveillance.                           - Return to GI office (date not yet determined). Procedure Code(s):        --- Professional ---                           [phone], Colonoscopy, flexible; with removal of                            tumor(s), polyp(s), or other lesion(s) by snare                            technique Diagnosis Code(s):        --- Professional ---  Z86.010, Personal history of colonic polyps                           K63.5, Polyp of colon CPT copyright 2019 American [employer] Association. All rights reserved. The codes documented in this report are preliminary and upon coder review may  be revised to meet current compliance requirements. Cristopher Estimable. Suhey Radford, MD Norvel Richards, MD 06/09/2019 11:03:50 AM This report has been signed electronically. Number of Addenda: 0

## 2019-06-09 NOTE — Discharge Instructions (Signed)
Colonoscopy Discharge Instructions  Read the instructions outlined below and refer to this sheet in the next few weeks. These discharge instructions provide you with general information on caring for yourself after you leave the hospital. Your doctor may also give you specific instructions. While your treatment has been planned according to the most current medical practices available, unavoidable complications occasionally occur. If you have any problems or questions after discharge, call Dr. Gala Romney at [phone]. ACTIVITY  You may resume your regular activity, but move at a slower pace for the next 24 hours.   Take frequent rest periods for the next 24 hours.   Walking will help get rid of the air and reduce the bloated feeling in your belly (abdomen).   No driving for 24 hours (because of the medicine (anesthesia) used during the test).    Do not sign any important legal documents or operate any machinery for 24 hours (because of the anesthesia used during the test).  NUTRITION  Drink plenty of fluids.   You may resume your normal diet as instructed by your doctor.   Begin with a light meal and progress to your normal diet. Heavy or fried foods are harder to digest and may make you feel sick to your stomach (nauseated).   Avoid alcoholic beverages for 24 hours or as instructed.  MEDICATIONS  You may resume your normal medications unless your doctor tells you otherwise.  WHAT YOU CAN EXPECT TODAY  Some feelings of bloating in the abdomen.   Passage of more gas than usual.   Spotting of blood in your stool or on the toilet paper.  IF YOU HAD POLYPS REMOVED DURING THE COLONOSCOPY:  No aspirin products for 7 days or as instructed.   No alcohol for 7 days or as instructed.   Eat a soft diet for the next 24 hours.  FINDING OUT THE RESULTS OF YOUR TEST Not all test results are available during your visit. If your test results are not back during the visit, make an appointment  with your caregiver to find out the results. Do not assume everything is normal if you have not heard from your caregiver or the medical facility. It is important for you to follow up on all of your test results.  SEEK IMMEDIATE MEDICAL ATTENTION IF:  You have more than a spotting of blood in your stool.   Your belly is swollen (abdominal distention).   You are nauseated or vomiting.   You have a temperature over 101.   You have abdominal pain or discomfort that is severe or gets worse throughout the day.    Colon polyp information provided  Further recommendations to follow pending review of pathology report  At patient request, I called Dossie Arbour [phone] and reviewed results   Colon Polyps  Polyps are tissue growths inside the body. Polyps can grow in many places, including the large intestine (colon). A polyp may be a round bump or a mushroom-shaped growth. You could have one polyp or several. Most colon polyps are noncancerous (benign). However, some colon polyps can become cancerous over time. Finding and removing the polyps early can help prevent this. What are the causes? The exact cause of colon polyps is not known. What increases the risk? You are more likely to develop this condition if you:  Have a family history of colon cancer or colon polyps.  Are older than 90 or older than 45 if you are African American.  Have inflammatory bowel disease, such  as ulcerative colitis or Crohn's disease.  Have certain hereditary conditions, such as: ? Familial adenomatous polyposis. ? Lynch syndrome. ? Turcot syndrome. ? Peutz-Jeghers syndrome.  Are overweight.  Smoke cigarettes.  Do not get enough exercise.  Drink too much alcohol.  Eat a diet that is high in fat and red meat and low in fiber.  Had childhood cancer that was treated with abdominal radiation. What are the signs or symptoms? Most polyps do not cause symptoms. If you have symptoms, they may  include:  Blood coming from your rectum when having a bowel movement.  Blood in your stool. The stool may look dark red or black.  Abdominal pain.  A change in bowel habits, such as constipation or diarrhea. How is this diagnosed? This condition is diagnosed with a colonoscopy. This is a procedure in which a lighted, flexible scope is inserted into the anus and then passed into the colon to examine the area. Polyps are sometimes found when a colonoscopy is done as part of routine cancer screening tests. How is this treated? Treatment for this condition involves removing any polyps that are found. Most polyps can be removed during a colonoscopy. Those polyps will then be tested for cancer. Additional treatment may be needed depending on the results of testing. Follow these instructions at home: Lifestyle  Maintain a healthy weight, or lose weight if recommended by your health care provider.  Exercise every day or as told by your health care provider.  Do not use any products that contain nicotine or tobacco, such as cigarettes and e-cigarettes. If you need help quitting, ask your health care provider.  If you drink alcohol, limit how much you have: ? 0-1 drink a day for women. ? 0-2 drinks a day for men.  Be aware of how much alcohol is in your drink. In the [place] one drink equals one 12 oz bottle of beer (355 mL), one 5 oz glass of wine (148 mL), or one 1 oz shot of hard liquor (44 mL). Eating and drinking   Eat foods that are high in fiber, such as fruits, vegetables, and whole grains.  Eat foods that are high in calcium and vitamin D, such as milk, cheese, yogurt, eggs, liver, fish, and broccoli.  Limit foods that are high in fat, such as fried foods and desserts.  Limit the amount of red meat and processed meat you eat, such as hot dogs, sausage, bacon, and lunch meats. General instructions  Keep all follow-up visits as told by your health care provider. This is  important. ? This includes having regularly scheduled colonoscopies. ? Talk to your health care provider about when you need a colonoscopy. Contact a health care provider if:  You have new or worsening bleeding during a bowel movement.  You have new or increased blood in your stool.  You have a change in bowel habits.  You lose weight for no known reason. Summary  Polyps are tissue growths inside the body. Polyps can grow in many places, including the colon.  Most colon polyps are noncancerous (benign), but some can become cancerous over time.  This condition is diagnosed with a colonoscopy.  Treatment for this condition involves removing any polyps that are found. Most polyps can be removed during a colonoscopy. This information is not intended to replace advice given to you by your health care provider. Make sure you discuss any questions you have with your health care provider. Document Revised: 05/20/2017 Document Reviewed: 05/20/2017 Elsevier Patient  Education  2020 [employer]  

## 2019-06-12 ENCOUNTER — Encounter: Payer: Self-pay | Admitting: Internal Medicine

## 2019-06-12 DIAGNOSIS — H26493 Other secondary cataract, bilateral: Secondary | ICD-10-CM | POA: Diagnosis not present

## 2019-06-12 LAB — SURGICAL PATHOLOGY

## 2019-08-29 DIAGNOSIS — H43813 Vitreous degeneration, bilateral: Secondary | ICD-10-CM | POA: Diagnosis not present

## 2019-08-29 DIAGNOSIS — Z9842 Cataract extraction status, left eye: Secondary | ICD-10-CM | POA: Diagnosis not present

## 2019-08-29 DIAGNOSIS — Z961 Presence of intraocular lens: Secondary | ICD-10-CM | POA: Diagnosis not present

## 2019-08-29 DIAGNOSIS — H35373 Puckering of macula, bilateral: Secondary | ICD-10-CM | POA: Diagnosis not present

## 2019-08-29 DIAGNOSIS — H40013 Open angle with borderline findings, low risk, bilateral: Secondary | ICD-10-CM | POA: Diagnosis not present

## 2019-08-29 DIAGNOSIS — Z9841 Cataract extraction status, right eye: Secondary | ICD-10-CM | POA: Diagnosis not present

## 2019-08-29 DIAGNOSIS — H353132 Nonexudative age-related macular degeneration, bilateral, intermediate dry stage: Secondary | ICD-10-CM | POA: Diagnosis not present

## 2019-09-14 ENCOUNTER — Other Ambulatory Visit: Payer: Self-pay | Admitting: Family Medicine

## 2019-09-15 ENCOUNTER — Other Ambulatory Visit: Payer: Self-pay | Admitting: Family Medicine

## 2019-09-24 ENCOUNTER — Other Ambulatory Visit: Payer: Self-pay | Admitting: Family Medicine

## 2019-10-10 IMAGING — US US EXTREM LOW VENOUS*L*
1 series · 13 of 24 positions shown · non-contrast
Comparison: None.

CLINICAL DATA: 76-year-old male with a history leg trauma



[Series 1: us extrem low venous*left* · 0.07mm/px · 13 of 42 slices shown]
[im 1/42]
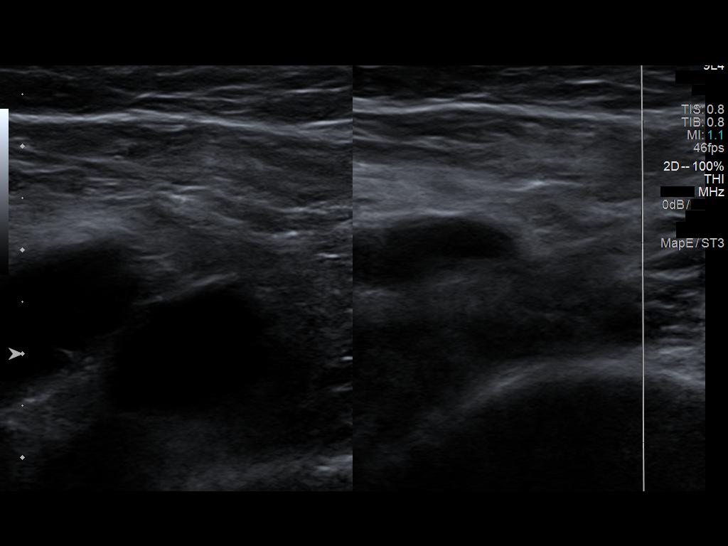
[im 4/42]
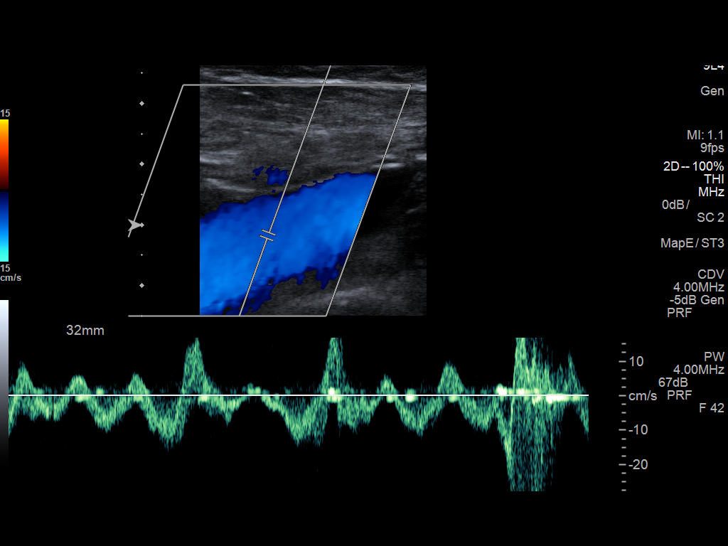
[im 8/42]
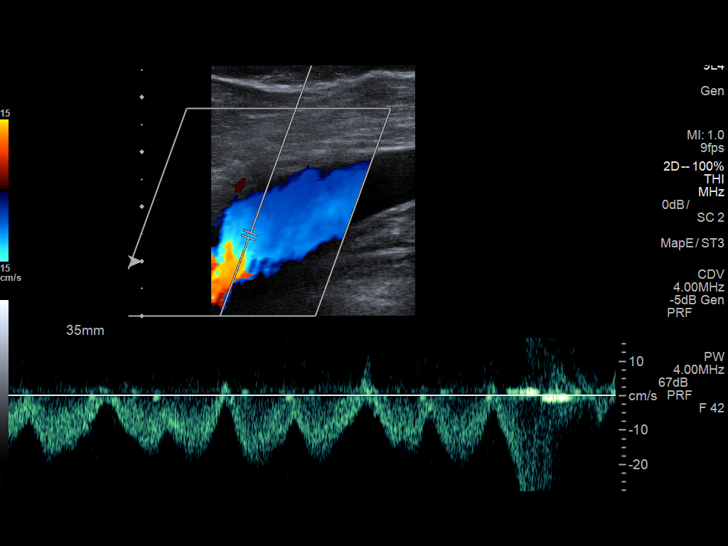
[im 11/42]
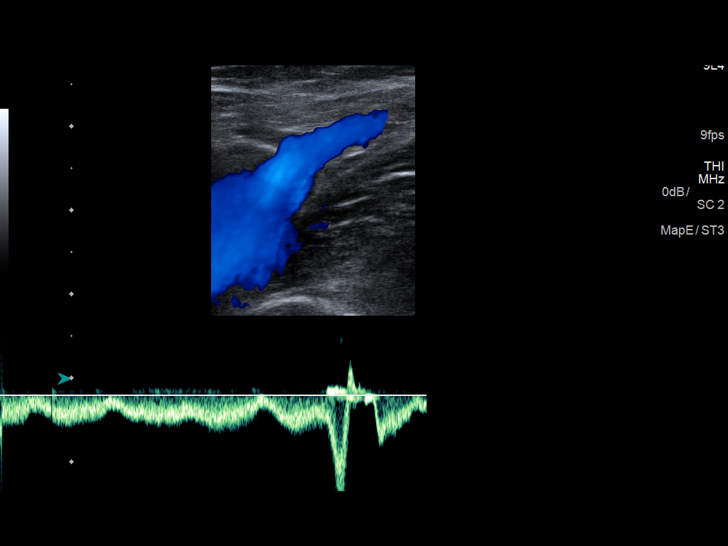
[im 15/42]
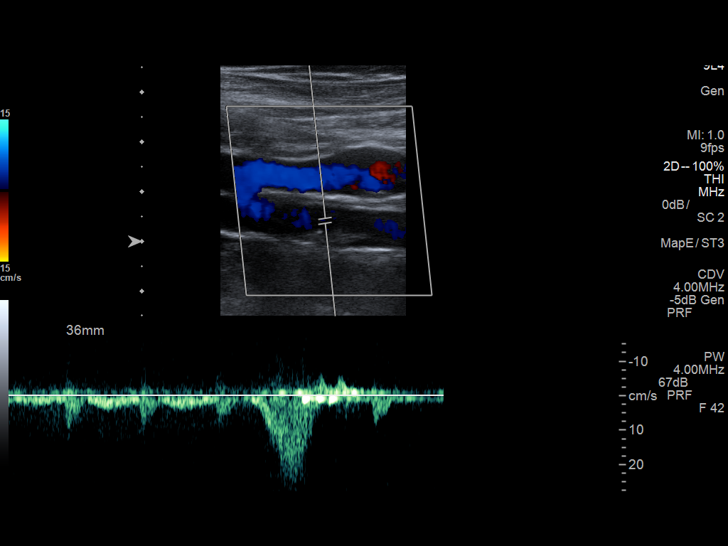
[im 18/42]
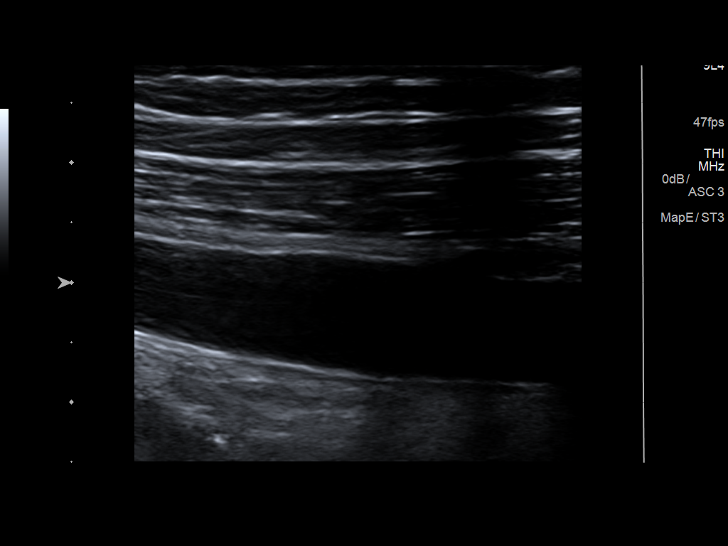
[im 22/42]
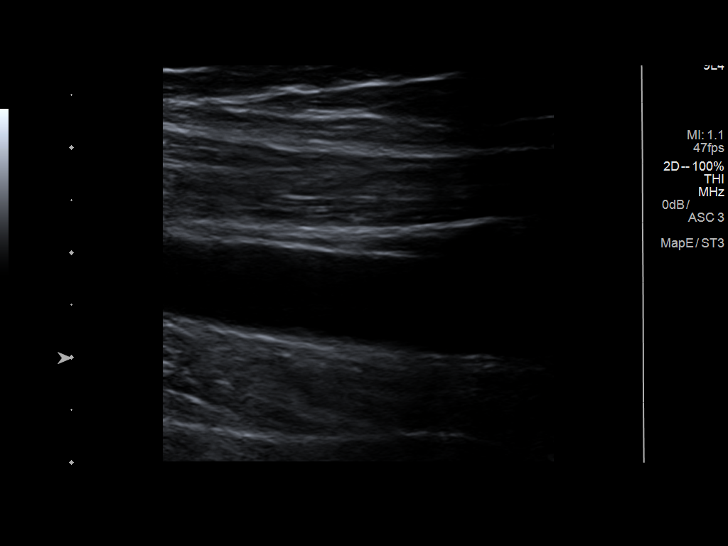
[im 24/42]
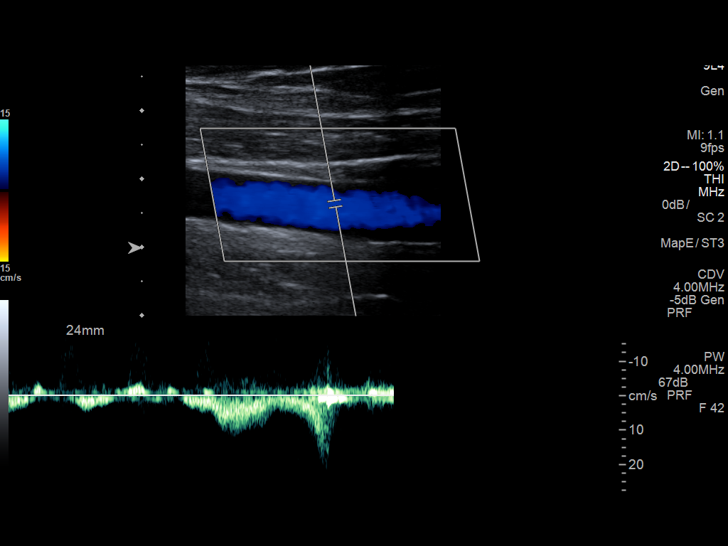
[im 27/42]
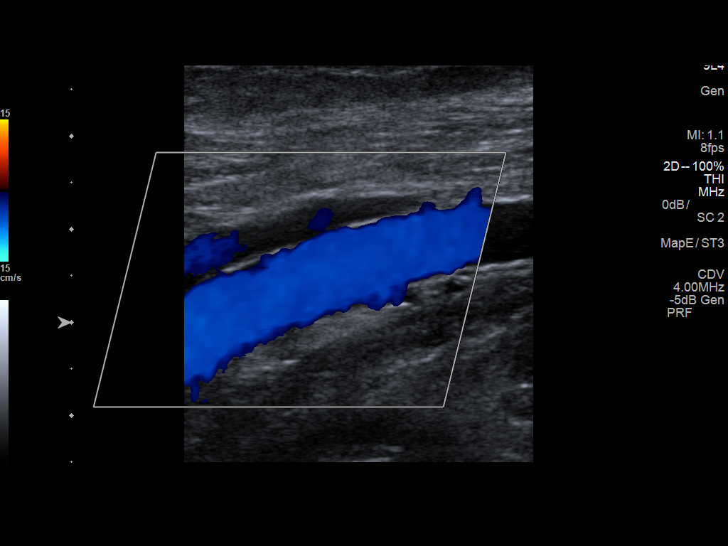
[im 31/42]
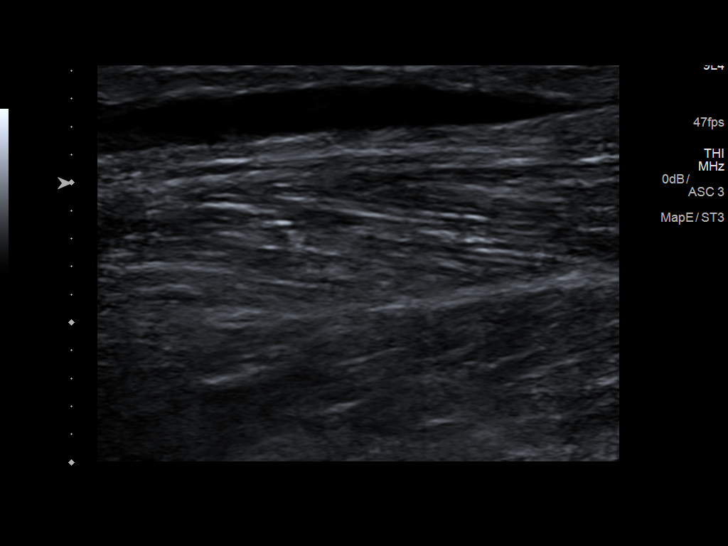
[im 34/42]
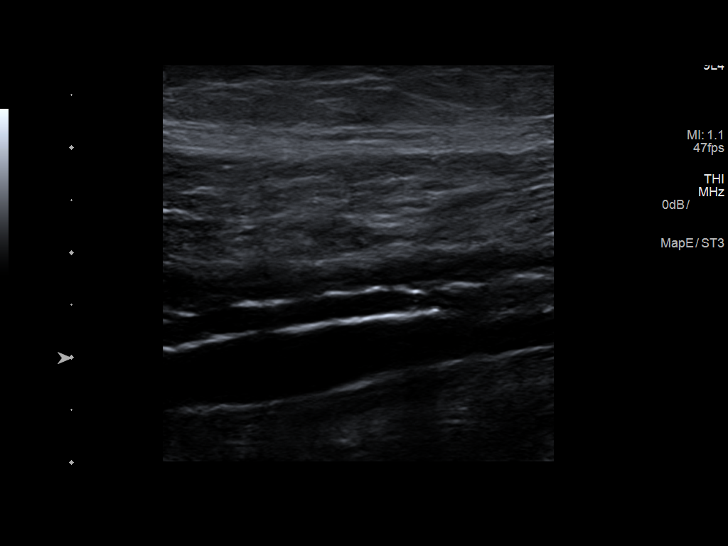
[im 38/42]
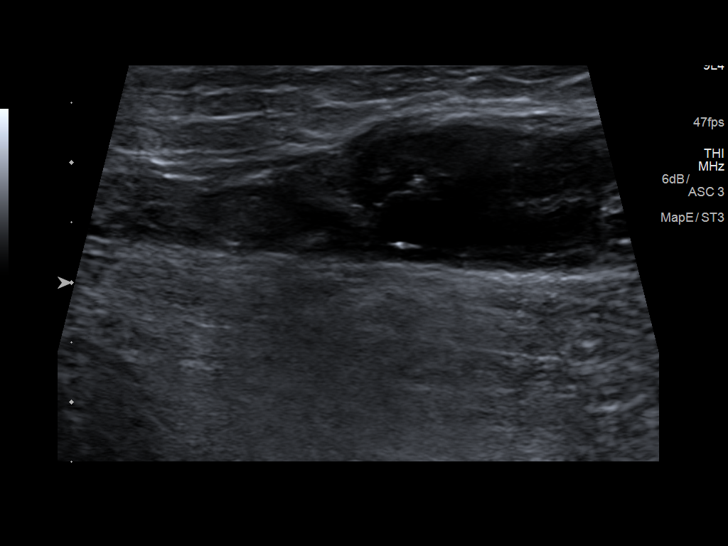
[im 42/42]
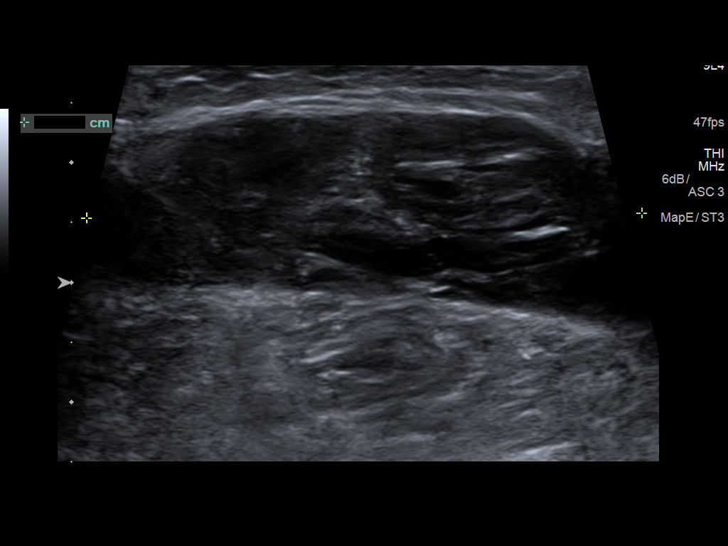

[13 of 24 positions shown; findings below may reference images not displayed]

FINDINGS: Contralateral Common Femoral Vein: Respiratory phasicity is normal
and symmetric with the symptomatic side. No evidence of thrombus.
Normal compressibility.

Common Femoral Vein: No evidence of thrombus. Normal
compressibility, respiratory phasicity and response to augmentation.

Saphenofemoral Junction: No evidence of thrombus. Normal
compressibility and flow on color Doppler imaging.

Profunda Femoral Vein: No evidence of thrombus. Normal
compressibility and flow on color Doppler imaging.

Femoral Vein: No evidence of thrombus. Normal compressibility,
respiratory phasicity and response to augmentation.

Popliteal Vein: No evidence of thrombus. Normal compressibility,
respiratory phasicity and response to augmentation.

Calf Veins: No evidence of thrombus. Normal compressibility and flow
on color Doppler imaging.

Superficial Great Saphenous Vein: No evidence of thrombus. Normal
compressibility and flow on color Doppler imaging.

Other Findings: Complex lentiform shaped fluid collection with
internal septations and mixed echogenicity of the anterior lower
leg. Fluid collection measures 12.3 cm x 1.6 cm x 4.6 cm with no
internal flow.
IMPRESSION: Sonographic survey of the left lower extremity negative for DVT.

Lentiform fluid collection with the anterior aspect of the lower
left leg measuring as large as 12.3 cm. Differential includes
evolving hematoma, complex seroma, or less likely infection.

## 2019-10-19 ENCOUNTER — Ambulatory Visit: Payer: Self-pay | Attending: Internal Medicine

## 2019-10-19 DIAGNOSIS — Z23 Encounter for immunization: Secondary | ICD-10-CM

## 2019-10-19 NOTE — Progress Notes (Signed)
   Covid-[facility] Vaccination [facility]  Name:  Jose King    MRN: [id number] DOB: 1940-04-07  10/19/2019  Jose King was observed post Covid-19 immunization for 15 minutes without incident. He was provided with Vaccine Information Sheet and instruction to access the V-Safe system.   Jose King was instructed to call 911 with any severe reactions post vaccine: Marland Kitchen Difficulty breathing  . Swelling of face and throat  . A fast heartbeat  . A bad rash all over body  . Dizziness and weakness

## 2019-10-25 ENCOUNTER — Other Ambulatory Visit: Payer: Self-pay | Admitting: Family Medicine

## 2019-10-28 ENCOUNTER — Other Ambulatory Visit: Payer: Self-pay | Admitting: Family Medicine

## 2019-10-31 ENCOUNTER — Other Ambulatory Visit: Payer: Self-pay | Admitting: Family Medicine

## 2020-01-23 ENCOUNTER — Other Ambulatory Visit: Payer: Self-pay | Admitting: Family Medicine

## 2020-02-20 ENCOUNTER — Encounter: Payer: Self-pay | Admitting: Dermatology

## 2020-02-20 ENCOUNTER — Other Ambulatory Visit: Payer: Self-pay

## 2020-02-20 ENCOUNTER — Ambulatory Visit: Payer: Medicare PPO | Admitting: Dermatology

## 2020-02-20 DIAGNOSIS — D485 Neoplasm of uncertain behavior of skin: Secondary | ICD-10-CM | POA: Diagnosis not present

## 2020-02-20 DIAGNOSIS — Z1283 Encounter for screening for malignant neoplasm of skin: Secondary | ICD-10-CM | POA: Diagnosis not present

## 2020-02-20 DIAGNOSIS — Z85828 Personal history of other malignant neoplasm of skin: Secondary | ICD-10-CM | POA: Diagnosis not present

## 2020-02-20 DIAGNOSIS — L570 Actinic keratosis: Secondary | ICD-10-CM

## 2020-02-20 DIAGNOSIS — L821 Other seborrheic keratosis: Secondary | ICD-10-CM | POA: Diagnosis not present

## 2020-02-20 DIAGNOSIS — L820 Inflamed seborrheic keratosis: Secondary | ICD-10-CM | POA: Diagnosis not present

## 2020-02-20 NOTE — Patient Instructions (Signed)

## 2020-02-24 ENCOUNTER — Encounter: Payer: Self-pay | Admitting: Dermatology

## 2020-02-24 NOTE — Progress Notes (Signed)
   Follow-Up Visit   Subjective  Jose King is a 80 y.o. male who presents for the following: Annual Exam (No new concerns).  General skin check Location:  Duration:  Quality:  Associated Signs/Symptoms: Modifying Factors:  Severity:  Timing: Context: History of nonmelanoma skin cancers  Objective  Well appearing patient in no apparent distress; mood and affect are within normal limits. Objective  Chest - Medial [facility]): Waist up skin examination, no atypical moles, melanoma, or recurrent nonmelanoma skin cancer.  Objective  Head - Anterior (Face) (3): Half dozen 2 to 4 mm hornlike pink crusts  Objective  Right Preauricular Area: 8 mm waxy pink crusted papule     Objective  Mid Back: 6 mm textured flattopped light brown papule, historically stable   All skin waist up examined.   Assessment & Plan    Encounter for screening for malignant neoplasm of skin Chest - Medial [facility])  Annual dermatology examination, encouraged to self examine with spouse twice annually.  AK (actinic keratosis) (3) Head - Anterior (Face)  Destruction of lesion - Head - Anterior (Face) Complexity: simple   Destruction method: cryotherapy   Informed consent: discussed and consent obtained   Timeout:  patient name, date of birth, surgical site, and procedure verified Lesion destroyed using liquid nitrogen: Yes   Cryotherapy cycles:  5 Outcome: patient tolerated procedure well with no complications   Post-procedure details: wound care instructions given    Neoplasm of uncertain behavior of skin Right Preauricular Area  Skin / nail biopsy Type of biopsy: tangential   Informed consent: discussed and consent obtained   Timeout: patient name, date of birth, surgical site, and procedure verified   Anesthesia: the lesion was anesthetized in a standard fashion   Anesthetic:  1% lidocaine w/ epinephrine 1-100,000 local infiltration Instrument used: flexible razor blade    Hemostasis achieved with: ferric subsulfate   Outcome: patient tolerated procedure well   Post-procedure details: sterile dressing applied and wound care instructions given   Dressing type: bandage and petrolatum    Specimen 1 - Surgical pathology Differential Diagnosis: bcc scc  Check Margins: No  Seborrheic keratosis Mid Back  Biopsy if clinical change     I, Lavonna Monarch, MD, have reviewed all documentation for this visit.  The documentation on 02/24/20 for the exam, diagnosis, procedures, and orders are all accurate and complete.

## 2020-04-08 NOTE — Progress Notes (Signed)
Subjective:   Jose King is a 80 y.o. male who presents for Medicare Annual/Subsequent preventive examination.  I connected with Lynnette Caffey  today by telephone and verified that I am speaking with the correct person using two identifiers. Location patient: home Location provider: work Persons participating in the virtual visit: patient, provider.   I discussed the limitations, risks, security and privacy concerns of performing an evaluation and management service by telephone and the availability of in person appointments. I also discussed with the patient that there may be a patient responsible charge related to this service. The patient expressed understanding and verbally consented to this telephonic visit.    Interactive audio and video telecommunications were attempted between this provider and patient, however failed, due to patient having technical difficulties OR patient did not have access to video capability.  We continued and completed visit with audio only.      Review of Systems    N/A  Cardiac Risk Factors include: advanced age (>67men, >71 women);male gender     Objective:    Today's Vitals   There is no height or weight on file to calculate BMI.  Advanced Directives 04/10/2020 06/09/2019 01/16/2019 03/08/2017 06/19/2016 05/09/2014  Does Patient Have a Medical Advance Directive? Yes Yes Yes No No No;Yes  Type of Paramedic of [place];Living will [place];Living will Living will - - Living will  Does patient want to make changes to medical advance directive? No - Patient declined - - - - -  Copy of [place] in Chart? No - copy requested No - copy requested - - - -    Current Medications (verified) Outpatient Encounter Medications as of 04/10/2020  Medication Sig   amLODipine (NORVASC) 5 MG tablet TAKE 1 TABLET(5 MG) BY MOUTH DAILY   aspirin 81 MG tablet Take 81 mg by mouth daily.    atorvastatin (LIPITOR) 20 MG tablet TAKE 1 TABLET(20 MG) BY MOUTH DAILY   calcium carbonate (OS-CAL - DOSED IN MG OF ELEMENTAL CALCIUM) 1250 (500 Ca) MG tablet Take 1 tablet by mouth 3 (three) times a week.   Multiple Vitamins-Minerals (PRESERVISION AREDS PO) Take 1 tablet by mouth in the morning and at bedtime.    polyethylene glycol-electrolytes (TRILYTE) 420 g solution Take 4,000 mLs by mouth as directed. (Patient not taking: Reported on 04/10/2020)   No facility-administered encounter medications on file as of 04/10/2020.    Allergies (verified) Patient has no known allergies.   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 repeat 5 years   COLONOSCOPY  2003   RMR: normal   COLONOSCOPY  1999   RMR: adenomatous ICV   COLONOSCOPY N/A 05/09/2014   Procedure: COLONOSCOPY;  Surgeon: Daneil Dolin, MD;  [facility];  Service: Endoscopy;  Laterality: N/A;  9:15am   COLONOSCOPY N/A 06/09/2019   Procedure: COLONOSCOPY;  Surgeon: Daneil Dolin, MD;  [facility];  Service: Endoscopy;  Laterality: N/A;   9:45am   EYE SURGERY  12/2018   cataract    INGUINAL HERNIA REPAIR  1985   POLYPECTOMY  06/09/2019   Procedure: POLYPECTOMY;  Surgeon: Daneil Dolin, MD;  [facility];  Service: Endoscopy;;   PROSTATE SURGERY     TONSILLECTOMY AND ADENOIDECTOMY  1948   Family History  Problem Relation Age of Onset   Cancer Mother 82       colon   Heart attack Father 3   Suicidality Brother    Cancer Brother 68       Pancreatic   Lymphoma Brother    Cancer Other        grandfather, mouth   Cancer Other        grandmother, intestional    Vision loss Paternal Uncle        Massive Stroke   Social History   Socioeconomic History   Marital status: Married    Spouse name: Not on file   Number of children: 0   Years of education: Not on file   Highest education level: Not on file  Occupational History   Occupation: Retired  Tobacco Use   Smoking status: Never Smoker   Smokeless tobacco: Never Used  Scientific laboratory technician Use: Never used  Substance and Sexual Activity   Alcohol use: No   Drug use: No   Sexual activity: Not on file  Other Topics Concern   Not on file  Social History Narrative   Married   Gets regular exercise from farm work   Social Determinants of Radio broadcast assistant Strain: Low Risk    Difficulty of Paying Living Expenses: Not hard at all  Food Insecurity: No Food Insecurity   Worried About Charity fundraiser in the Last Year: Never true   Arboriculturist in the Last Year: Never true  Transportation Needs: No Transportation Needs   Lack of Transportation (Medical): No   Lack of Transportation (Non-Medical): No  Physical Activity: Inactive   Days of Exercise per Week: 0 days   Minutes of Exercise per Session: 0 min  Stress: No Stress Concern Present   Feeling of Stress : Not at all  Social Connections: Moderately Integrated   Frequency of Communication with Friends and Family: More than three times a week    Frequency of Social Gatherings with Friends and Family: Three times a week   Attends Religious Services: More than 4 times per year   Active Member of Clubs or Organizations: No   Attends Archivist Meetings: Never   Marital Status: Married    Tobacco Counseling Counseling given: Not Answered   Clinical Intake:  Pre-visit preparation completed: Yes  Pain : No/denies pain     Nutritional Risks: None Diabetes: No  How often do you need to have someone help you when you read instructions, pamphlets, or other written materials from your doctor or pharmacy?: 3 - Sometimes (due to macular degeneration)  Diabetic?No  Interpreter Needed?: No  Information entered by :: [place] of Daily Living In your present state of health, do you have any difficulty performing the following activities: 04/10/2020  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 N  Difficulty concentrating or making decisions? N  Walking or climbing stairs? N  Dressing or bathing? N  Doing errands, shopping? N  Preparing Food and eating ? N  Using the Toilet? N  In the past six months, have you accidently leaked urine? N  Do you have problems with loss of bowel control? N  Managing your Medications? N  Managing your Finances? N  Housekeeping or managing your Housekeeping? N  Some recent data might be hidden    Patient Care Team: Eulas Post, MD as PCP - General (Family Medicine) Gala Romney Cristopher Estimable, MD as Consulting Physician (Gastroenterology) Lavonna Monarch, MD as Consulting Physician (Dermatology)  Indicate any recent Medical Services you may have received from other than [facility] providers in the past year (date may be approximate).     Assessment:   This is a routine wellness examination for Montel.  Hearing/Vision screen  Hearing Screening   125Hz  250Hz  500Hz  1000Hz  2000Hz  3000Hz  4000Hz  6000Hz  8000Hz   Right ear:           Left ear:           Vision  Screening Comments: Gets eyes examined every 6 months due to Macular degeneration. Wears reading glasses   Dietary issues and exercise activities discussed: Current Exercise Habits: The patient has a physically strenuous job, but has no regular exercise apart from work., Exercise limited by: None identified  Goals     DIET - REDUCE SUGAR INTAKE     Cut back on sweets; jellies; cake and pies       Patient Stated     01/16/2019 need for developing a routine exercise plan and decreasing intake of sweets.      Patient Stated     I want to maintain my independence and travel more!      Reduce sugar intake to X grams per day     Cut back on sweets and eat more        Depression Screen PHQ 2/9 Scores 04/10/2020 01/16/2019 12/22/2017 07/14/2017 06/19/2016 06/19/2015  PHQ - 2 Score 0 0 0 0 0 0    Fall Risk Fall Risk  04/10/2020 01/16/2019 12/22/2017 07/14/2017 06/19/2016  Falls in the past year? 0 0 0 No No  Number falls in past yr: 0 - - - -  Injury with Fall? 0 - - - -  Risk for fall due to : No Fall Risks Medication side effect - - -  Follow up Falls evaluation completed;Falls prevention discussed Falls evaluation completed;Education provided;Falls prevention discussed - - -    FALL RISK PREVENTION PERTAINING TO THE HOME:  Any stairs in or around the home? Yes  If so, are there any without handrails? No  Home free of loose throw rugs in walkways, pet beds, electrical cords, etc? Yes  Adequate lighting in your home to reduce risk of falls? Yes   ASSISTIVE DEVICES UTILIZED TO PREVENT FALLS:  Life alert? No  Use of a cane, walker or w/c? No  Grab bars in the bathroom? Yes  Shower chair or bench in shower? No  Elevated toilet seat or a handicapped toilet? No    Cognitive Function: Normal cognitive status assessed by direct observation by this Nurse Health Advisor. No  abnormalities found.   MMSE - Mini Mental State Exam 12/22/2017 06/19/2016  Not completed: (No Data) (No Data)      6CIT Screen 06/19/2016  What Year? 0 points  What month? 0 points  What time? 0 points  Count back from 20 0 points  Months in reverse 0 points  Repeat phrase 0 points  Total Score 0    Immunizations Immunization History  Administered Date(s) Administered   Influenza, High Dose Seasonal PF 11/18/2017, 11/02/2018   Influenza-Unspecified 11/14/2016, 12/01/2019   PFIZER(Purple Top)SARS-COV-2 Vaccination 03/03/2019, 03/24/2019, 10/19/2019   Pneumococcal Conjugate-13 12/13/2013   Tdap 11/14/2013, 06/19/2015   Zoster Recombinat (Shingrix) 06/26/2016, 11/15/2016    TDAP status: Up to date  Flu Vaccine status: Up to date  Pneumococcal vaccine status: Due, Education has been provided regarding the importance of this vaccine. Advised may receive this vaccine at local pharmacy or Health Dept. Aware to provide a copy of the vaccination record if obtained from local pharmacy or Health Dept. Verbalized acceptance and understanding.  Covid-19 vaccine status: Completed vaccines  Qualifies for Shingles Vaccine? Yes   Zostavax completed No   Shingrix Completed?: No.    Education has been provided regarding the importance of this vaccine. Patient has been advised to call insurance company to determine out of pocket expense if they have not yet received this vaccine. Advised may also receive vaccine at local pharmacy or Health Dept. Verbalized acceptance and understanding.  Screening Tests Health Maintenance  Topic Date Due   Hepatitis C Screening  Never done   COVID-19 Vaccine (4 - Booster for Pfizer series) 04/17/2020   TETANUS/TDAP  06/18/2025   INFLUENZA VACCINE  Completed   PNA vac Low Risk Adult  Completed    Health Maintenance  Health Maintenance Due  Topic Date Due   Hepatitis C Screening  Never done    Colorectal cancer screening: No longer required.   Lung Cancer Screening: (Low Dose CT Chest recommended if Age 88-80 years, 30 pack-year currently smoking OR have  quit w/in 15years.) does not qualify.   Lung Cancer Screening Referral: N/A   Additional Screening:  Hepatitis C Screening: does qualify;   Vision Screening: Recommended annual ophthalmology exams for early detection of glaucoma and other disorders of the eye. Is the patient up to date with their annual eye exam?  Yes  Who is the provider or what is the name of the office in which the patient attends annual eye exams? Dr. Manuella Ghazi If pt is not established with a provider, would they like to be referred to a provider to establish care? No .   Dental Screening: Recommended annual dental exams for proper oral hygiene  Community Resource Referral / Chronic Care Management: CRR required this visit?  No   CCM required this visit?  No      Plan:     I have personally reviewed and noted the following in the patients chart:    Medical and social history  Use of alcohol, tobacco or illicit drugs   Current medications and supplements  Functional ability and status  Nutritional status  Physical activity  Advanced directives  List of other physicians  Hospitalizations, surgeries, and ER visits in previous 12 months  Vitals  Screenings to include cognitive, depression, and falls  Referrals and appointments  In addition, I have reviewed and discussed with patient certain preventive protocols, quality metrics, and best practice recommendations. A written personalized care plan for preventive services as well as general preventive health recommendations  were provided to patient.     Ofilia Neas, LPN   [id number]   Nurse Notes: None

## 2020-04-09 ENCOUNTER — Ambulatory Visit: Payer: Medicare PPO

## 2020-04-10 ENCOUNTER — Ambulatory Visit (INDEPENDENT_AMBULATORY_CARE_PROVIDER_SITE_OTHER): Payer: Medicare PPO

## 2020-04-10 DIAGNOSIS — Z0000 Encounter for general adult medical examination without abnormal findings: Secondary | ICD-10-CM | POA: Diagnosis not present

## 2020-04-10 NOTE — Patient Instructions (Addendum)
[name] , Thank you for taking time to come for your Medicare Wellness Visit. I appreciate your ongoing commitment to your health goals. Please review the following plan we discussed and let me know if I can assist you in the future.   Screening recommendations/referrals: Colonoscopy: No longer required Recommended yearly ophthalmology/optometry visit for glaucoma screening and checkup Recommended yearrly dental visit for hygiene and checkup  Vaccinations: Influenza vaccine: Up to date, next due fall 2022  Pneumococcal vaccine: Due for Pneumovax 23  Tdap vaccine: Up to date, next due 06/18/2025 Shingles vaccine: Completed series     Advanced directives: Please bring copies of your advanced medical directives so that we may scan them into your chart.   Conditions/risks identified: None   Next appointment: 04/23/20 @ 9:30 am with Dr. Elease Hashimoto  Preventive Care 65 Years and Older, Male Preventive care refers to lifestyle choices and visits with your health care provider that can promote health and wellness. What does preventive care include?  A yearly physical exam. This is also called an annual well check.  Dental exams once or twice a year.  Routine eye exams. Ask your health care provider how often you should have your eyes checked.  Personal lifestyle choices, including:  Daily care of your teeth and gums.  Regular physical activity.  Eating a healthy diet.  Avoiding tobacco and drug use.  Limiting alcohol use.  Practicing safe sex.  Taking low doses of aspirin every day.  Taking vitamin and mineral supplements as recommended by your health care provider. What happens during an annual well check? The services and screenings done by your health care provider during your annual well check will depend on your age, overall health, lifestyle risk factors, and family history of disease. Counseling  Your health care provider may ask you questions about your:  Alcohol  use.  Tobacco use.  Drug use.  Emotional well-being.  Home and relationship well-being.  Sexual activity.  Eating habits.  History of falls.  Memory and ability to understand (cognition).  Work and work Statistician. Screening  You may have the following tests or measurements:  Height, weight, and BMI.  Blood pressure.  Lipid and cholesterol levels. These may be checked every 5 years, or more frequently if you are over 22 years old.  Skin check.  Lung cancer screening. You may have this screening every year starting at age 32 if you have a 30-pack-year history of smoking and currently smoke or have quit within the past 15 years.  Fecal occult blood test (FOBT) of the stool. You may have this test every year starting at age 34.  Flexible sigmoidoscopy or colonoscopy. You may have a sigmoidoscopy every 5 years or a colonoscopy every 10 years starting at age 13.  Prostate cancer screening. Recommendations will vary depending on your family history and other risks.  Hepatitis C blood test.  Hepatitis B blood test.  Sexually transmitted disease (STD) testing.  Diabetes screening. This is done by checking your blood sugar (glucose) after you have not eaten for a while (fasting). You may have this done every 1-3 years.  Abdominal aortic aneurysm (AAA) screening. You may need this if you are a current or former smoker.  Osteoporosis. You may be screened starting at age 47 if you are at high risk. Talk with your health care provider about your test results, treatment options, and if necessary, the need for more tests. Vaccines  Your health care provider may recommend certain vaccines, such as:  Influenza vaccine. This is recommended every year.  Tetanus, diphtheria, and acellular pertussis (Tdap, Td) vaccine. You may need a Td booster every 10 years.  Zoster vaccine. You may need this after age 19.  Pneumococcal 13-valent conjugate (PCV13) vaccine. One dose is  recommended after age 32.  Pneumococcal polysaccharide (PPSV23) vaccine. One dose is recommended after age 51. Talk to your health care provider about which screenings and vaccines you need and how often you need them. This information is not intended to replace advice given to you by your health care provider. Make sure you discuss any questions you have with your health care provider. Document Released: 03/01/2015 Document Revised: 10/23/2015 Document Reviewed: 12/04/2014 Elsevier Interactive Patient Education  2017 [place] Prevention in the Home Falls can cause injuries. They can happen to people of all ages. There are many things you can do to make your home safe and to help prevent falls. What can I do on the outside of my home?  Regularly fix the edges of walkways and driveways and fix any cracks.  Remove anything that might make you trip as you walk through a door, such as a raised step or threshold.  Trim any bushes or trees on the path to your home.  Use bright outdoor lighting.  Clear any walking paths of anything that might make someone trip, such as rocks or tools.  Regularly check to see if handrails are loose or broken. Make sure that both sides of any steps have handrails.  Any raised decks and porches should have guardrails on the edges.  Have any leaves, snow, or ice cleared regularly.  Use sand or salt on walking paths during winter.  Clean up any spills in your garage right away. This includes oil or grease spills. What can I do in the bathroom?  Use night lights.  Install grab bars by the toilet and in the tub and shower. Do not use towel bars as grab bars.  Use non-skid mats or decals in the tub or shower.  If you need to sit down in the shower, use a plastic, non-slip stool.  Keep the floor dry. Clean up any water that spills on the floor as soon as it happens.  Remove soap buildup in the tub or shower regularly.  Attach bath mats  securely with double-sided non-slip rug tape.  Do not have throw rugs and other things on the floor that can make you trip. What can I do in the bedroom?  Use night lights.  Make sure that you have a light by your bed that is easy to reach.  Do not use any sheets or blankets that are too big for your bed. They should not hang down onto the floor.  Have a firm chair that has side arms. You can use this for support while you get dressed.  Do not have throw rugs and other things on the floor that can make you trip. What can I do in the kitchen?  Clean up any spills right away.  Avoid walking on wet floors.  Keep items that you use a lot in easy-to-reach places.  If you need to reach something above you, use a strong step stool that has a grab bar.  Keep electrical cords out of the way.  Do not use floor polish or wax that makes floors slippery. If you must use wax, use non-skid floor wax.  Do not have throw rugs and other things on the floor that  can make you trip. What can I do with my stairs?  Do not leave any items on the stairs.  Make sure that there are handrails on both sides of the stairs and use them. Fix handrails that are broken or loose. Make sure that handrails are as long as the stairways.  Check any carpeting to make sure that it is firmly attached to the stairs. Fix any carpet that is loose or worn.  Avoid having throw rugs at the top or bottom of the stairs. If you do have throw rugs, attach them to the floor with carpet tape.  Make sure that you have a light switch at the top of the stairs and the bottom of the stairs. If you do not have them, ask someone to add them for you. What else can I do to help prevent falls?  Wear shoes that:  Do not have high heels.  Have rubber bottoms.  Are comfortable and fit you well.  Are closed at the toe. Do not wear sandals.  If you use a stepladder:  Make sure that it is fully opened. Do not climb a closed  stepladder.  Make sure that both sides of the stepladder are locked into place.  Ask someone to hold it for you, if possible.  Clearly mark and make sure that you can see:  Any grab bars or handrails.  First and last steps.  Where the edge of each step is.  Use tools that help you move around (mobility aids) if they are needed. These include:  Canes.  Walkers.  Scooters.  Crutches.  Turn on the lights when you go into a dark area. Replace any light bulbs as soon as they burn out.  Set up your furniture so you have a clear path. Avoid moving your furniture around.  If any of your floors are uneven, fix them.  If there are any pets around you, be aware of where they are.  Review your medicines with your doctor. Some medicines can make you feel dizzy. This can increase your chance of falling. Ask your doctor what other things that you can do to help prevent falls. This information is not intended to replace advice given to you by your health care provider. Make sure you discuss any questions you have with your health care provider. Document Released: 11/29/2008 Document Revised: 07/11/2015 Document Reviewed: 03/09/2014 Elsevier Interactive Patient Education  2017 [employer].

## 2020-04-23 ENCOUNTER — Telehealth: Payer: Medicare PPO | Admitting: Family Medicine

## 2020-04-25 ENCOUNTER — Telehealth: Payer: Self-pay | Admitting: Family Medicine

## 2020-04-25 ENCOUNTER — Other Ambulatory Visit: Payer: Self-pay | Admitting: Adult Health

## 2020-04-25 ENCOUNTER — Other Ambulatory Visit: Payer: Self-pay | Admitting: Physician Assistant

## 2020-04-25 ENCOUNTER — Telehealth (INDEPENDENT_AMBULATORY_CARE_PROVIDER_SITE_OTHER): Payer: Medicare PPO | Admitting: Family Medicine

## 2020-04-25 ENCOUNTER — Encounter: Payer: Self-pay | Admitting: Family Medicine

## 2020-04-25 DIAGNOSIS — I10 Essential (primary) hypertension: Secondary | ICD-10-CM

## 2020-04-25 DIAGNOSIS — U071 COVID-19: Secondary | ICD-10-CM

## 2020-04-25 NOTE — Progress Notes (Signed)
I connected by phone with Jose King on 04/25/2020 at 10:24 AM to discuss the potential use of a new treatment for mild to moderate COVID-19 viral infection in non-hospitalized patients.  This patient is a 80 y.o. male that meets the FDA criteria for Emergency Use Authorization of COVID monoclonal antibody sotrovimab.   Has a (+) direct SARS-CoV-2 viral test result  Has mild or moderate COVID-19   Is NOT hospitalized due to COVID-19  Is within 10 days of symptom onset  Has at least one of the high risk factor(s) for progression to severe COVID-19 and/or hospitalization as defined in EUA.  Specific high risk criteria : Older age (>/= 80 yo), BMI > 25 and Cardiovascular disease or hypertension   I have spoken and communicated the following to the patient or parent/caregiver regarding COVID monoclonal antibody treatment:  1. FDA has authorized the emergency use for the treatment of mild to moderate COVID-19 in adults and pediatric patients with positive results of direct SARS-CoV-2 viral testing who are 19 years of age and older weighing at least 40 kg, and who are at high risk for progressing to severe COVID-19 and/or hospitalization.  2. The significant known and potential risks and benefits of COVID monoclonal antibody, and the extent to which such potential risks and benefits are unknown.  3. Information on available alternative treatments and the risks and benefits of those alternatives, including clinical trials.  4. Patients treated with COVID monoclonal antibody should continue to self-isolate and use infection control measures (e.g., wear mask, isolate, social distance, avoid sharing personal items, clean and disinfect "high touch" surfaces, and frequent handwashing) according to CDC guidelines.   5. The patient or parent/caregiver has the option to accept or refuse COVID monoclonal antibody treatment.  After reviewing this information with the patient, the patient has  agreed to receive one of the available covid 19 monoclonal antibodies and will be provided an appropriate fact sheet prior to infusion.  Sx onset 3/9. Set up for infusion on 3/11 @ 10:30am. Directions given to [facility]. Pt is aware that insurance will be charged an infusion fee.   Angelena Form 04/25/2020 10:24 AM

## 2020-04-25 NOTE — Progress Notes (Signed)
[phone] is the preferred # for the patient Spoke with Dr. Sarajane Jews who called to get Mr. Obenchain referred for treatment tomorrow.  He is Covid positive this morning.    I placed a referral on his behalf.    Wilber Bihari, NP

## 2020-04-25 NOTE — Progress Notes (Signed)
° °  Subjective:    Patient ID: Jose King, male    DOB: 05/23/1940, 80 y.o.   MRN: [id number]  HPI Virtual Visit via Telephone Note  I connected with the patient on 04/25/20 at  9:30 AM EST by telephone and verified that I am speaking with the correct person using two identifiers.   I discussed the limitations, risks, security and privacy concerns of performing an evaluation and management service by telephone and the availability of in person appointments. I also discussed with the patient that there may be a patient responsible charge related to this service. The patient expressed understanding and agreed to proceed.  Location patient: home Location provider: work or home office Participants present for the call: patient, provider Patient did not have a visit in the prior 7 days to address this/these issue(s).   History of Present Illness: Here for 2 days of hoarse voice, ST, body aches, and a dry cough. No fever or SOB or NVD. Drinking fluids and taking Tylenol. He tested positive for the Covid virus with an at home test this morning. His wife tested positive last Saturday and she received a MAB infusion on Monday. She is now feeling much better.    Observations/Objective: Patient sounds cheerful and well on the phone. I do not appreciate any SOB. Speech and thought processing are grossly intact. Patient reported vitals:  Assessment and Plan: Covid -19 infection. We referred him to the MAB infusion clinic, and they plan to infuse him tomorrow.  Alysia Penna, MD   Follow Up Instructions:     [phone] 5-10 [phone] 11-20 [phone] 21-30 I did not refer this patient for an OV in the next 24 hours for this/these issue(s).  I discussed the assessment and treatment plan with the patient. The patient was provided an opportunity to ask questions and all were answered. The patient agreed with the plan and demonstrated an understanding of the instructions.   The patient was advised to  call back or seek an in-person evaluation if the symptoms worsen or if the condition fails to improve as anticipated.  I provided 12 minutes of non-face-to-face time during this encounter.   Alysia Penna, MD    Review of Systems     Objective:   Physical Exam        Assessment & Plan:

## 2020-04-25 NOTE — Telephone Encounter (Signed)
pts spouse is calling in stating that the pt has tested positive for COVID and the symptoms started on yesterday per the Triage Nurse.  Pts spouse would like to see if he can get the monoclonal antibody.  Pt would like to have a call back.  Pt is aware that his provider is not in the office today but will address on tomorrow.

## 2020-04-26 ENCOUNTER — Ambulatory Visit (HOSPITAL_COMMUNITY)
Admission: RE | Admit: 2020-04-26 | Discharge: 2020-04-26 | Disposition: A | Discharge status: Home / Self Care | Payer: Medicare PPO | Source: Ambulatory Visit | Attending: Pulmonary Disease | Admitting: Pulmonary Disease

## 2020-04-26 DIAGNOSIS — R54 Age-related physical debility: Secondary | ICD-10-CM | POA: Diagnosis not present

## 2020-04-26 DIAGNOSIS — I10 Essential (primary) hypertension: Secondary | ICD-10-CM | POA: Diagnosis not present

## 2020-04-26 DIAGNOSIS — Z6825 Body mass index (BMI) 25.0-25.9, adult: Secondary | ICD-10-CM | POA: Diagnosis not present

## 2020-04-26 DIAGNOSIS — U071 COVID-19: Secondary | ICD-10-CM | POA: Diagnosis not present

## 2020-04-26 MED ORDER — SODIUM CHLORIDE 0.9 % IV SOLN: INTRAVENOUS | Status: DC | PRN

## 2020-04-26 MED ORDER — EPINEPHRINE 0.3 MG/0.3ML IJ SOAJ: 0.3000 mg | Freq: Once | INTRAMUSCULAR | Status: DC | PRN

## 2020-04-26 MED ORDER — DIPHENHYDRAMINE HCL 50 MG/ML IJ SOLN: 50.0000 mg | Freq: Once | INTRAMUSCULAR | Status: DC | PRN

## 2020-04-26 MED ORDER — METHYLPREDNISOLONE SODIUM SUCC 125 MG IJ SOLR: 125.0000 mg | Freq: Once | INTRAMUSCULAR | Status: DC | PRN

## 2020-04-26 MED ORDER — SOTROVIMAB 500 MG/8ML IV SOLN
500.0000 mg | Freq: Once | INTRAVENOUS | Status: AC
  Administered 2020-04-26: 500 mg via INTRAVENOUS

## 2020-04-26 MED ORDER — ALBUTEROL SULFATE HFA 108 (90 BASE) MCG/ACT IN AERS: 2.0000 | INHALATION_SPRAY | Freq: Once | RESPIRATORY_TRACT | Status: DC | PRN

## 2020-04-26 MED ORDER — FAMOTIDINE IN NACL 20-0.9 MG/50ML-% IV SOLN: 20.0000 mg | Freq: Once | INTRAVENOUS | Status: DC | PRN

## 2020-04-26 NOTE — Telephone Encounter (Signed)
Spoke with patient this has been arrange , pt was at this infusion.

## 2020-04-26 NOTE — Progress Notes (Signed)
Patient reviewed Fact Sheet for Patients, Parents, and Caregivers for Emergency Use Authorization (EUA) of sotrovimab for the Treatment of Coronavirus. Patient also reviewed and is agreeable to the estimated cost of treatment. Patient is agreeable to proceed.   

## 2020-04-26 NOTE — Telephone Encounter (Signed)
It looks like this was addressed yesterday- but would call pt to make sure.  He had visit with Dr Sarajane Jews and referral already made for monoclonal antibody  infusion.

## 2020-04-26 NOTE — Progress Notes (Signed)
Diagnosis: COVID-19  Physician: Dr. Patrick Wright  Procedure: [facility] Infusion Clinic Med: Sotrovimab infusion - Provided patient with sotrovimab fact sheet for patients, parents, and caregivers prior to infusion.   Complications: No immediate complications noted  Discharge: Discharged home    

## 2020-04-26 NOTE — Discharge Instructions (Signed)

## 2020-04-27 ENCOUNTER — Other Ambulatory Visit: Payer: Self-pay | Admitting: Family Medicine

## 2020-04-29 ENCOUNTER — Other Ambulatory Visit (HOSPITAL_COMMUNITY): Payer: Self-pay

## 2020-04-30 ENCOUNTER — Telehealth: Payer: Medicare PPO | Admitting: Family Medicine

## 2020-04-30 ENCOUNTER — Encounter: Payer: Self-pay | Admitting: Family Medicine

## 2020-04-30 VITALS — Ht 70.0 in

## 2020-04-30 DIAGNOSIS — E785 Hyperlipidemia, unspecified: Secondary | ICD-10-CM | POA: Diagnosis not present

## 2020-04-30 DIAGNOSIS — U071 COVID-19: Secondary | ICD-10-CM

## 2020-04-30 DIAGNOSIS — I10 Essential (primary) hypertension: Secondary | ICD-10-CM | POA: Diagnosis not present

## 2020-04-30 NOTE — Progress Notes (Signed)
Patient ID: Jose King, male   DOB: [birth year], 80 y.o.   MRN: [id number]   This visit type was conducted due to national recommendations for restrictions regarding the COVID-19 pandemic in an effort to limit this patient's exposure and mitigate transmission in our community.   Virtual Visit via Video Note  I connected with Jose King on 04/30/20 at  8:30 AM EDT by a video enabled telemedicine application and verified that I am speaking with the correct person using two identifiers.  Location patient: home Location provider:work or home office Persons participating in the virtual visit: patient, provider  I discussed the limitations of evaluation and management by telemedicine and the availability of in person appointments. The patient expressed understanding and agreed to proceed.   HPI:  Jose King had COVID with onset of symptoms last Thursday.  He had severe sore throat predominantly.  He had been fully vaccinated.  He was diagnosed with COVID and sent for monoclonal antibody infusion and pretty much felt better by the next day.  He feels much improved at this time.  No dyspnea.  No significant cough.  No fever.  Sore throat improved.  His wife also had monoclonal antibodies and they are both recovering at this point.  He has not been in the office in over a year.  He has history of hyperlipidemia and hypertension.  His medications include Lipitor 20 mg daily and amlodipine 5 mg daily.  Denies any side effects.  He states that he had blood pressure of 160/84 and when he went in for monoclonal antibody infusion.  We look back at records and when he had colonoscopy last year at this time he had blood pressure reading there and of 162/92.  He has a home blood pressure cuff but does not feel this is accurate.  Denies any recent headaches or dizziness.  No peripheral edema issues.  No recent chest pains.   ROS: See pertinent positives and negatives per HPI.  Past Medical History:   Diagnosis Date  . Atypical nevus 09/04/2008   Right Dorsal Foot - Slight to Moderate, and Left Upper Back - Slight to Moderate  . 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 Cataract   . Hyperlipidemia   . Hypertension   . Macular degeneration   . 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 Superficial basal cell carcinoma (BCC) 08/23/2002   V of Neck    Past Surgical History:  Procedure Laterality Date  . BACK SURGERY  1985  . BASAL CELL CARCINOMA EXCISION  2017  . CATARACT EXTRACTION, BILATERAL Bilateral   . COLONOSCOPY  05/10/2009   RMR: normal. repeat 5 years  . COLONOSCOPY  2003   RMR: normal  . COLONOSCOPY  1999   RMR: adenomatous ICV  . COLONOSCOPY N/A 05/09/2014   Procedure: COLONOSCOPY;  Surgeon: Daneil Dolin, MD;  [facility];  Service: Endoscopy;  Laterality: N/A;  9:15am  . COLONOSCOPY N/A 06/09/2019   Procedure: COLONOSCOPY;  Surgeon: Daneil Dolin, MD;  [facility];  Service: Endoscopy;  Laterality: N/A;  9:45am  . EYE SURGERY  12/2018   cataract   . INGUINAL HERNIA REPAIR  1985  . POLYPECTOMY  06/09/2019   Procedure: POLYPECTOMY;  Surgeon: Daneil Dolin, MD;  Location: [facility];  Service: Endoscopy;;  . PROSTATE SURGERY    . TONSILLECTOMY AND ADENOIDECTOMY  1948    Family History  Problem Relation Age of Onset  . Cancer Mother 21       colon  . Heart attack Father 1  . Suicidality Brother   . Cancer Brother 95       Pancreatic  . Lymphoma Brother   . Cancer Other        grandfather, mouth  . Cancer Other        grandmother, intestional   . Vision loss Paternal Uncle        Massive Stroke     SOCIAL HX: Non-smoker.  Married.   Current Outpatient Medications:  .  amLODipine (NORVASC) 5 MG tablet, TAKE 1 TABLET(5 MG) BY MOUTH DAILY, Disp: 90 tablet, Rfl: 2 .  aspirin 81 MG tablet, Take 81 mg by mouth daily., Disp: , Rfl:  .  atorvastatin (LIPITOR) 20 MG tablet, TAKE 1 TABLET(20 MG) BY MOUTH DAILY, Disp: 90 tablet, Rfl: 3 .  calcium carbonate (OS-CAL - DOSED IN MG OF ELEMENTAL CALCIUM) 1250 (500 Ca) MG tablet, Take 1 tablet by mouth 3 (three) times a week., Disp: , Rfl:  .  Multiple Vitamins-Minerals (PRESERVISION AREDS PO), Take 1 tablet by mouth in the morning and at bedtime. , Disp: , Rfl:  .  polyethylene glycol-electrolytes (TRILYTE) 420 g solution, Take 4,000 mLs by mouth as directed., Disp: 4000 mL, Rfl: 0  EXAM:  VITALS per patient if applicable:  GENERAL: alert, oriented, appears well and in no acute distress  HEENT: atraumatic, conjunttiva clear, no obvious abnormalities on inspection of external nose and ears  NECK: normal movements of the head and neck  LUNGS: on inspection no signs of respiratory distress, breathing rate appears normal, no obvious gross SOB, gasping or wheezing  CV: no obvious cyanosis  MS: moves all visible extremities without noticeable abnormality  PSYCH/NEURO: pleasant and cooperative, no obvious depression or anxiety, speech and thought processing grossly intact  ASSESSMENT AND PLAN:  Discussed the following assessment and plan:  #1 recent COVID-19 infection.  Improved following monoclonal antibody infusion.  No major residual symptoms at this time.  He had questions regarding Covid booster but we explained that he cannot get any kind of booster until least 90 days because the infusion.  #2 hypertension.  Elevated by recent readings.  We recommend setting up an office follow-up in the next few weeks to reassess.  Will need to either consider increasing amlodipine or adding additional medication if not improved at that time -In the  meantime continue amlodipine 5 mg daily -Watch sodium intake  #3 hyperlipidemia treated with atorvastatin 20 mg daily.  Tolerating well without side effects. -Continue Lipitor 20 mg daily.  He does need follow-up labs and we decided that he will come fasting for follow-up in 3 weeks and get labs at that time.     I discussed the assessment and treatment plan with the patient. The patient was provided an opportunity to ask questions and all were answered. The patient agreed with the plan and demonstrated an understanding of the instructions.   The patient was advised to call back or seek an in-person evaluation if the symptoms worsen or if the condition fails to improve as anticipated.  Carolann Littler, MD

## 2020-05-03 DIAGNOSIS — H35373 Puckering of macula, bilateral: Secondary | ICD-10-CM | POA: Diagnosis not present

## 2020-05-03 DIAGNOSIS — H43813 Vitreous degeneration, bilateral: Secondary | ICD-10-CM | POA: Diagnosis not present

## 2020-05-03 DIAGNOSIS — H40013 Open angle with borderline findings, low risk, bilateral: Secondary | ICD-10-CM | POA: Diagnosis not present

## 2020-05-03 DIAGNOSIS — H353132 Nonexudative age-related macular degeneration, bilateral, intermediate dry stage: Secondary | ICD-10-CM | POA: Diagnosis not present

## 2020-05-03 DIAGNOSIS — Z9842 Cataract extraction status, left eye: Secondary | ICD-10-CM | POA: Diagnosis not present

## 2020-05-03 DIAGNOSIS — Z961 Presence of intraocular lens: Secondary | ICD-10-CM | POA: Diagnosis not present

## 2020-05-03 DIAGNOSIS — Z9841 Cataract extraction status, right eye: Secondary | ICD-10-CM | POA: Diagnosis not present

## 2020-05-20 ENCOUNTER — Other Ambulatory Visit: Payer: Self-pay

## 2020-05-21 ENCOUNTER — Ambulatory Visit: Payer: Medicare PPO | Admitting: Family Medicine

## 2020-05-21 ENCOUNTER — Encounter: Payer: Self-pay | Admitting: Family Medicine

## 2020-05-21 VITALS — BP 142/80 | HR 70 | Temp 97.7°F | Ht 70.0 in | Wt 187.7 lb

## 2020-05-21 DIAGNOSIS — I10 Essential (primary) hypertension: Secondary | ICD-10-CM

## 2020-05-21 DIAGNOSIS — E785 Hyperlipidemia, unspecified: Secondary | ICD-10-CM

## 2020-05-21 LAB — HEPATIC FUNCTION PANEL
ALT: 23 U/L (ref 0–53)
AST: 19 U/L (ref 0–37)
Albumin: 4.3 g/dL (ref 3.5–5.2)
Alkaline Phosphatase: 93 U/L (ref 39–117)
Bilirubin, Direct: 0.1 mg/dL (ref 0.0–0.3)
Total Bilirubin: 0.7 mg/dL (ref 0.2–1.2)
Total Protein: 6.4 g/dL (ref 6.0–8.3)

## 2020-05-21 LAB — LIPID PANEL
Cholesterol: 145 mg/dL (ref 0–200)
HDL: 48.5 mg/dL (ref >39.00)
LDL Cholesterol: 78 mg/dL (ref 0–99)
NonHDL: 96.69
Total CHOL/HDL Ratio: 3
Triglycerides: 94 mg/dL (ref 0.0–149.0)
VLDL: 18.8 mg/dL (ref 0.0–40.0)

## 2020-05-21 LAB — BASIC METABOLIC PANEL
BUN: 17 mg/dL (ref 6–23)
CO2: 28 mEq/L (ref 19–32)
Calcium: 9.3 mg/dL (ref 8.4–10.5)
Chloride: 106 mEq/L (ref 96–112)
Creatinine, Ser: 1.04 mg/dL (ref 0.40–1.50)
GFR: 68.24 mL/min (ref >60.00)
Glucose, Bld: 98 mg/dL (ref 70–99)
Potassium: 4 mEq/L (ref 3.5–5.1)
Sodium: 142 mEq/L (ref 135–145)

## 2020-05-21 NOTE — Patient Instructions (Signed)
Consider getting new BP cuff and monitor.     Be in touch if BP consistently > 140 on top number.  Consider stopping the aspirin

## 2020-05-21 NOTE — Progress Notes (Signed)
Established Patient Office Visit  Subjective:  Patient ID: Jose King, male    DOB: 04/05/1940  Age: 80 y.o. MRN: [id number]  CC:  Chief Complaint  Patient presents with  . Annual Exam    No new concerns    HPI [name] presents for medical follow-up.  He had COVID back in the winter.  He and his wife are fully recovered.  His symptoms are relatively mild and brief.  Patient has history of hypertension and hyperlipidemia.  He has had previous history of adenomatous colon polyps and positive family history of colon cancer.  He had recent colonoscopy per GI.  His medications include amlodipine 5 mg daily and Lipitor 20 mg daily.  He also takes baby aspirin.  No history of cerebrovascular disease or coronary artery disease.  Family history significant for father having coronary disease in 73s.  Mother had colon cancer in her 83s.  Brother with pancreatic cancer.  Patient generally doing well.  No recent chest pains.  No dyspnea.  He states he has "slowed down" over the past few years but overall doing well.  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  Does garden avidly.  Past Medical History:  Diagnosis Date  . Atypical nevus 09/04/2008   Right Dorsal Foot - Slight to Moderate, and Left Upper Back - Slight to Moderate  . 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 Cataract   . Hyperlipidemia   . Hypertension   . Macular degeneration   . 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 Superficial basal cell carcinoma (BCC) 08/23/2002   V of Neck    Past Surgical History:  Procedure Laterality Date  . BACK SURGERY   1985  . BASAL CELL CARCINOMA EXCISION  2017  . CATARACT EXTRACTION, BILATERAL Bilateral   . COLONOSCOPY  05/10/2009   RMR: normal. repeat 5 years  . COLONOSCOPY  2003   RMR: normal  . COLONOSCOPY  1999   RMR: adenomatous ICV  . COLONOSCOPY N/A 05/09/2014   Procedure: COLONOSCOPY;  Surgeon: Daneil Dolin, MD;  [facility];  Service: Endoscopy;  Laterality: N/A;  9:15am  . COLONOSCOPY N/A 06/09/2019   Procedure: COLONOSCOPY;  Surgeon: Daneil Dolin, MD;  [facility];  Service: Endoscopy;  Laterality: N/A;  9:45am  . EYE SURGERY  12/2018   cataract   . INGUINAL HERNIA REPAIR  1985  . POLYPECTOMY  06/09/2019   Procedure: POLYPECTOMY;  Surgeon: Daneil Dolin, MD;  [facility];  Service: Endoscopy;;  . PROSTATE SURGERY    . TONSILLECTOMY AND ADENOIDECTOMY  1948    Family History  Problem Relation Age of Onset  . Cancer Mother 41       colon  . Heart attack Father 79  . Suicidality Brother   . Cancer Brother 22       Pancreatic  . Lymphoma Brother   . Cancer Other        grandfather, mouth  . Cancer Other        grandmother, intestional   .  Vision loss Paternal Uncle        Massive Stroke    Social History   Socioeconomic History  . Marital status: Married    Spouse name: Not on file  . Number of children: 0  . Years of education: Not on file  . Highest education level: Not on file  Occupational History  . Occupation: Retired  Tobacco Use  . Smoking status: Never Smoker  . Smokeless tobacco: Never Used  Vaping Use  . Vaping Use: Never used  Substance and Sexual Activity  . Alcohol use: No  . Drug use: No  . Sexual activity: Not on file  Other Topics Concern  . Not on file  Social History Narrative   Married   Gets regular exercise from farm work   Social Determinants of Radio broadcast assistant Strain: Low Risk   . Difficulty of Paying Living Expenses: Not hard at all  Food Insecurity: No Food Insecurity  . Worried  About Charity fundraiser in the Last Year: Never true  . Ran Out of Food in the Last Year: Never true  Transportation Needs: No Transportation Needs  . Lack of Transportation (Medical): No  . Lack of Transportation (Non-Medical): No  Physical Activity: Inactive  . Days of Exercise per Week: 0 days  . Minutes of Exercise per Session: 0 min  Stress: No Stress Concern Present  . Feeling of Stress : Not at all  Social Connections: Moderately Integrated  . Frequency of Communication with Friends and Family: More than three times a week  . Frequency of Social Gatherings with Friends and Family: Three times a week  . Attends Religious Services: More than 4 times per year  . Active Member of Clubs or Organizations: No  . Attends Archivist Meetings: Never  . Marital Status: Married  Human resources officer Violence: Not At Risk  . Fear of Current or Ex-Partner: No  . Emotionally Abused: No  . Physically Abused: No  . Sexually Abused: No    Outpatient Medications Prior to Visit  Medication Sig Dispense Refill  . amLODipine (NORVASC) 5 MG tablet TAKE 1 TABLET(5 MG) BY MOUTH DAILY 90 tablet 2  . aspirin 81 MG tablet Take 81 mg by mouth daily.    Marland Kitchen atorvastatin (LIPITOR) 20 MG tablet TAKE 1 TABLET(20 MG) BY MOUTH DAILY 90 tablet 3  . calcium carbonate (OS-CAL - DOSED IN MG OF ELEMENTAL CALCIUM) 1250 (500 Ca) MG tablet Take 1 tablet by mouth 3 (three) times a week.    . Multiple Vitamins-Minerals (PRESERVISION AREDS PO) Take 1 tablet by mouth in the morning and at bedtime.     . polyethylene glycol-electrolytes (TRILYTE) 420 g solution Take 4,000 mLs by mouth as directed. 4000 mL 0   No facility-administered medications prior to visit.    No Known Allergies  ROS Review of Systems  Constitutional: Negative for fatigue and unexpected weight change.  Eyes: Negative for visual disturbance.  Respiratory: Negative for cough, chest tightness and shortness of breath.   Cardiovascular:  Negative for chest pain, palpitations and leg swelling.  Endocrine: Negative for polydipsia and polyuria.  Neurological: Negative for dizziness, syncope, weakness, light-headedness and headaches.      Objective:    Physical Exam Constitutional:      Appearance: He is well-developed.  Eyes:     Pupils: Pupils are equal, round, and reactive to light.  Neck:     Thyroid: No thyromegaly.  Cardiovascular:  Rate and Rhythm: Normal rate and regular rhythm.  Pulmonary:     Effort: Pulmonary effort is normal. No respiratory distress.     Breath sounds: Normal breath sounds. No wheezing or rales.  Musculoskeletal:     Cervical back: Neck supple.  Neurological:     Mental Status: He is alert and oriented to person, place, and time.     BP (!) 142/80 (BP Location: Left Arm, Cuff Size: Normal)   Pulse 70   Temp 97.7 F (36.5 C) (Oral)   Ht 5\' 10"  (1.778 m)   Wt 187 lb 11.2 oz (85.1 kg)   SpO2 97%   BMI 26.93 kg/m  Wt Readings from Last 3 Encounters:  05/21/20 187 lb 11.2 oz (85.1 kg)  06/09/19 185 lb (83.9 kg)  05/02/19 191 lb (86.6 kg)     Health Maintenance Due  Topic Date Due  . Hepatitis C Screening  Never done  . COVID-19 Vaccine (4 - Booster for Pfizer series) 04/17/2020    There are no preventive care reminders to display for this patient.  Lab Results  Component Value Date   TSH 2.98 03/28/2018   Lab Results  Component Value Date   WBC 3.9 (L) 07/14/2017   HGB 15.1 07/14/2017   HCT 44.0 07/14/2017   MCV 86.9 07/14/2017   PLT 123.0 (L) 07/14/2017   Lab Results  Component Value Date   NA 140 11/09/2018   K 3.7 11/09/2018   CO2 30 11/09/2018   GLUCOSE 92 11/09/2018   BUN 19 11/09/2018   CREATININE 1.01 11/09/2018   BILITOT 0.8 11/09/2018   ALKPHOS 105 11/09/2018   AST 17 11/09/2018   ALT 22 11/09/2018   PROT 6.4 11/09/2018   ALBUMIN 4.2 11/09/2018   CALCIUM 9.3 11/09/2018   GFR 71.42 11/09/2018   Lab Results  Component Value Date   CHOL 140  11/09/2018   Lab Results  Component Value Date   HDL 43.30 11/09/2018   Lab Results  Component Value Date   LDLCALC 68 11/09/2018   Lab Results  Component Value Date   TRIG 141.0 11/09/2018   Lab Results  Component Value Date   CHOLHDL 3 11/09/2018   No results found for: HGBA1C    Assessment & Plan:   #1 hypertension.  Slightly elevated initial reading here and repeat after rest 142/80.  We recommend he monitor at home and be in touch of consistent systolic readings over 342.  -Continue amlodipine 5 mg daily -Recommend 46-month follow-up to reassess -Check basic metabolic panel  #2 hyperlipidemia treated with Lipitor  -Recheck lipid and hepatic panel -Continue Lipitor 20 mg daily  #3 prevention.  Patient had questions regarding PSA screening.  We explained that most guidelines do not recommend screening after age 77.  We decided to hold after full discussion of risk and benefits at this time He will continue to follow-up with GI regarding colon cancer screening.  Does have strong family history of colon cancer and not clear if they plan to offer any further screens at this point in his life.  No orders of the defined types were placed in this encounter.   Follow-up: Return in about 6 months (around 11/20/2020).    Carolann Littler, MD

## 2020-10-24 ENCOUNTER — Other Ambulatory Visit: Payer: Self-pay | Admitting: Family Medicine

## 2020-11-01 ENCOUNTER — Telehealth: Payer: Self-pay | Admitting: Family Medicine

## 2020-11-01 NOTE — Telephone Encounter (Signed)
Rx has been sent in. 

## 2020-11-01 NOTE — Telephone Encounter (Signed)
PT spouse called to request a refill of atorvastatin (LIPITOR) 20 MG tablet to be sent into the Pharmacy on file.

## 2020-11-05 ENCOUNTER — Other Ambulatory Visit: Payer: Self-pay

## 2020-11-05 ENCOUNTER — Ambulatory Visit (INDEPENDENT_AMBULATORY_CARE_PROVIDER_SITE_OTHER): Payer: Medicare PPO | Admitting: Orthopaedic Surgery

## 2020-11-05 ENCOUNTER — Encounter: Payer: Self-pay | Admitting: Orthopaedic Surgery

## 2020-11-05 ENCOUNTER — Ambulatory Visit: Payer: Medicare PPO

## 2020-11-05 VITALS — BP 169/95 | HR 73 | Ht 70.0 in | Wt 187.0 lb

## 2020-11-05 DIAGNOSIS — G8929 Other chronic pain: Secondary | ICD-10-CM

## 2020-11-05 DIAGNOSIS — Z9842 Cataract extraction status, left eye: Secondary | ICD-10-CM | POA: Diagnosis not present

## 2020-11-05 DIAGNOSIS — M25561 Pain in right knee: Secondary | ICD-10-CM

## 2020-11-05 DIAGNOSIS — H43813 Vitreous degeneration, bilateral: Secondary | ICD-10-CM | POA: Diagnosis not present

## 2020-11-05 DIAGNOSIS — H40013 Open angle with borderline findings, low risk, bilateral: Secondary | ICD-10-CM | POA: Diagnosis not present

## 2020-11-05 DIAGNOSIS — Z9841 Cataract extraction status, right eye: Secondary | ICD-10-CM | POA: Diagnosis not present

## 2020-11-05 DIAGNOSIS — H353132 Nonexudative age-related macular degeneration, bilateral, intermediate dry stage: Secondary | ICD-10-CM | POA: Diagnosis not present

## 2020-11-05 DIAGNOSIS — H35373 Puckering of macula, bilateral: Secondary | ICD-10-CM | POA: Diagnosis not present

## 2020-11-05 DIAGNOSIS — Z961 Presence of intraocular lens: Secondary | ICD-10-CM | POA: Diagnosis not present

## 2020-11-05 NOTE — Progress Notes (Signed)
Subjective:    Patient ID: Jose King, male    DOB: 1940-05-01, 80 y.o.   MRN: [id number]  HPI His right knee has been tender more and more over the last several months.  He had a twisting injury about 10 days ago or so and had more pain and swelling.  He has lateral pain.  He has difficulty if he gets down on the ground and trying to get back up. He has no giving way.  He has no trauma or redness.  He took one Aleve for three days and did not see any difference.   Review of Systems  Constitutional:  Positive for activity change.  Musculoskeletal:  Positive for arthralgias, gait problem and joint swelling.  All other systems reviewed and are negative. For Review of Systems, all other systems reviewed and are negative.  The following is a summary of the past history medically, past history surgically, known current medicines, social history and family history.  This information is gathered electronically by the computer from prior information and documentation.  I review this each visit and have found including this information at this point in the chart is beneficial and informative.   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 repeat 5 years   COLONOSCOPY  2003   RMR: normal   COLONOSCOPY  1999   RMR: adenomatous ICV   COLONOSCOPY N/A 05/09/2014   Procedure: COLONOSCOPY;  Surgeon: Daneil Dolin, MD;  [facility];  Service: Endoscopy;  Laterality: N/A;  9:15am   COLONOSCOPY N/A 06/09/2019   Procedure: COLONOSCOPY;  Surgeon: Daneil Dolin, MD;  [facility];  Service: Endoscopy;  Laterality: N/A;  9:45am   EYE SURGERY  12/2018   cataract    INGUINAL HERNIA REPAIR  1985   POLYPECTOMY  06/09/2019   Procedure: POLYPECTOMY;  Surgeon: Daneil Dolin, MD;  Location: [facility];  Service: Endoscopy;;   [place]    Current Outpatient Medications on File Prior to Visit  Medication Sig Dispense Refill   amLODipine (NORVASC) 5 MG tablet TAKE 1 TABLET(5 MG) BY MOUTH DAILY 90 tablet 2   atorvastatin (LIPITOR) 20 MG tablet TAKE 1 TABLET(20 MG) BY MOUTH DAILY 90 tablet 3   calcium carbonate (OS-CAL - DOSED IN MG OF ELEMENTAL CALCIUM) 1250 (500 Ca) MG tablet Take 1 tablet by mouth 3 (three) times a week.     Multiple Vitamins-Minerals (PRESERVISION  AREDS PO) Take 1 tablet by mouth in the morning and at bedtime.      polyethylene glycol-electrolytes (TRILYTE) 420 g solution Take 4,000 mLs by mouth as directed. 4000 mL 0   No current facility-administered medications on file prior to visit.    Social History   Socioeconomic History   Marital status: Married    Spouse name: Not on file   Number of children: 0   Years of education: Not on file   Highest education level: Not on file  Occupational History   Occupation: Retired  Tobacco Use   Smoking status: Never   Smokeless tobacco: Never  Vaping Use   Vaping Use: Never used  Substance and Sexual Activity   Alcohol use: No   Drug use: No   Sexual activity: Not on file  Other Topics Concern   Not  on file  Social History Narrative   Married   Gets regular exercise from farm work   Social Determinants of Radio broadcast assistant Strain: Low Risk    Difficulty of Paying Living Expenses: Not hard at all  Food Insecurity: No Food Insecurity   Worried About Charity fundraiser in the Last Year: Never true   Arboriculturist in the Last Year: Never true  Transportation Needs: No Transportation Needs   Lack of Transportation (Medical): No   Lack of Transportation (Non-Medical): No  Physical Activity: Inactive   Days of Exercise per Week: 0 days   Minutes of Exercise per Session: 0 min  Stress: No Stress Concern Present   Feeling of Stress : Not at all  Social Connections: Moderately Integrated   Frequency of Communication with Friends and Family: More than three times a week   Frequency of Social Gatherings with Friends and Family: Three times a week   Attends Religious Services: More than 4 times per year   Active Member of Clubs or Organizations: No   Attends Archivist Meetings: Never   Marital Status: Married  Human resources officer Violence: Not At Risk   Fear of Current or Ex-Partner: No   Emotionally Abused: No   Physically Abused: No   Sexually Abused: No    Family History  Problem Relation Age of Onset   Cancer Mother 106       colon   Heart attack Father 59   Suicidality Brother    Cancer Brother 35       Pancreatic   Lymphoma Brother    Cancer Other        grandfather, mouth   Cancer Other        grandmother, intestional    Vision loss Paternal Uncle        Massive Stroke    BP (!) 169/95   Pulse 73   Ht 5\' 10"  (1.778 m)   Wt 187 lb (84.8 kg)   BMI 26.83 kg/m   Body mass index is 26.83 kg/m.     Objective:   Physical Exam Vitals and nursing note reviewed. Exam conducted with a chaperone present.  Constitutional:      Appearance: He is well-developed.  HENT:     Head: Normocephalic and atraumatic.  Eyes:     Conjunctiva/sclera:  Conjunctivae normal.     Pupils: Pupils are equal, round, and reactive to light.  Cardiovascular:     Rate and Rhythm: Normal rate and regular rhythm.  Pulmonary:     Effort: Pulmonary effort is normal.  Abdominal:  Palpations: Abdomen is soft.  Musculoskeletal:     Cervical back: Normal range of motion and neck supple.       Legs:  Skin:    General: Skin is warm and dry.  Neurological:     Mental Status: He is alert and oriented to person, place, and time.     Cranial Nerves: No cranial nerve deficit.     Motor: No abnormal muscle tone.     Coordination: Coordination normal.     Deep Tendon Reflexes: Reflexes are normal and symmetric. Reflexes normal.  Psychiatric:        Behavior: Behavior normal.        Thought Content: Thought content normal.        Judgment: Judgment normal.   X-rays were done of the right knee, reported separately.  More DJD laterally.       Assessment & Plan:   Encounter Diagnosis  Name Primary?   Chronic pain of right knee Yes   PROCEDURE NOTE:  The patient requests injections of the right knee , verbal consent was obtained.  The right knee was prepped appropriately after time out was performed.   Sterile technique was observed and injection of 1 cc of Celestone 6mg  with several cc's of plain xylocaine. Anesthesia was provided by ethyl chloride and a 20-gauge needle was used to inject the knee area. The injection was tolerated well.  A band aid dressing was applied.  The patient was advised to apply ice later today and tomorrow to the injection sight as needed.   Take one Aleve bid pc   Return in three weeks.  He may need MRI.  Call if any problem.  Precautions discussed.  Electronically Signed Sanjuana Kava, MD 9/20/20223:53 PM

## 2020-11-28 ENCOUNTER — Encounter: Payer: Self-pay | Admitting: Orthopaedic Surgery

## 2020-11-28 ENCOUNTER — Other Ambulatory Visit: Payer: Self-pay

## 2020-11-28 ENCOUNTER — Ambulatory Visit: Payer: Medicare PPO | Admitting: Orthopaedic Surgery

## 2020-11-28 VITALS — BP 147/87 | HR 94 | Ht 70.0 in | Wt 190.1 lb

## 2020-11-28 DIAGNOSIS — G8929 Other chronic pain: Secondary | ICD-10-CM | POA: Diagnosis not present

## 2020-11-28 DIAGNOSIS — M25561 Pain in right knee: Secondary | ICD-10-CM | POA: Diagnosis not present

## 2020-11-28 NOTE — Progress Notes (Signed)
I am much better.  That shot really helped.  He has had good results from the injection to the right knee.  He is walking well, no pain.  ROM of the right knee is full, normal gait, some crepitus, stable.  Encounter Diagnosis  Name Primary?   Chronic pain of right knee Yes   I will see as needed.  Call if any problem.  Precautions discussed.  Electronically Signed Sanjuana Kava, MD 10/13/20229:10 AM

## 2020-12-03 DIAGNOSIS — H353122 Nonexudative age-related macular degeneration, left eye, intermediate dry stage: Secondary | ICD-10-CM | POA: Diagnosis not present

## 2020-12-03 DIAGNOSIS — Z9841 Cataract extraction status, right eye: Secondary | ICD-10-CM | POA: Diagnosis not present

## 2020-12-03 DIAGNOSIS — H353211 Exudative age-related macular degeneration, right eye, with active choroidal neovascularization: Secondary | ICD-10-CM | POA: Diagnosis not present

## 2020-12-03 DIAGNOSIS — Z9842 Cataract extraction status, left eye: Secondary | ICD-10-CM | POA: Diagnosis not present

## 2020-12-03 DIAGNOSIS — Z961 Presence of intraocular lens: Secondary | ICD-10-CM | POA: Diagnosis not present

## 2020-12-03 DIAGNOSIS — H40013 Open angle with borderline findings, low risk, bilateral: Secondary | ICD-10-CM | POA: Diagnosis not present

## 2020-12-03 DIAGNOSIS — H43813 Vitreous degeneration, bilateral: Secondary | ICD-10-CM | POA: Diagnosis not present

## 2020-12-03 DIAGNOSIS — H35373 Puckering of macula, bilateral: Secondary | ICD-10-CM | POA: Diagnosis not present

## 2020-12-31 DIAGNOSIS — Z9842 Cataract extraction status, left eye: Secondary | ICD-10-CM | POA: Diagnosis not present

## 2020-12-31 DIAGNOSIS — H43813 Vitreous degeneration, bilateral: Secondary | ICD-10-CM | POA: Diagnosis not present

## 2020-12-31 DIAGNOSIS — H40013 Open angle with borderline findings, low risk, bilateral: Secondary | ICD-10-CM | POA: Diagnosis not present

## 2020-12-31 DIAGNOSIS — H35373 Puckering of macula, bilateral: Secondary | ICD-10-CM | POA: Diagnosis not present

## 2020-12-31 DIAGNOSIS — H353122 Nonexudative age-related macular degeneration, left eye, intermediate dry stage: Secondary | ICD-10-CM | POA: Diagnosis not present

## 2020-12-31 DIAGNOSIS — H353211 Exudative age-related macular degeneration, right eye, with active choroidal neovascularization: Secondary | ICD-10-CM | POA: Diagnosis not present

## 2020-12-31 DIAGNOSIS — Z961 Presence of intraocular lens: Secondary | ICD-10-CM | POA: Diagnosis not present

## 2020-12-31 DIAGNOSIS — Z9841 Cataract extraction status, right eye: Secondary | ICD-10-CM | POA: Diagnosis not present

## 2021-01-15 ENCOUNTER — Other Ambulatory Visit: Payer: Self-pay | Admitting: Family Medicine

## 2021-01-28 DIAGNOSIS — Z961 Presence of intraocular lens: Secondary | ICD-10-CM | POA: Diagnosis not present

## 2021-01-28 DIAGNOSIS — Z9841 Cataract extraction status, right eye: Secondary | ICD-10-CM | POA: Diagnosis not present

## 2021-01-28 DIAGNOSIS — H353211 Exudative age-related macular degeneration, right eye, with active choroidal neovascularization: Secondary | ICD-10-CM | POA: Diagnosis not present

## 2021-01-28 DIAGNOSIS — Z9842 Cataract extraction status, left eye: Secondary | ICD-10-CM | POA: Diagnosis not present

## 2021-01-28 DIAGNOSIS — H35373 Puckering of macula, bilateral: Secondary | ICD-10-CM | POA: Diagnosis not present

## 2021-01-28 DIAGNOSIS — H43813 Vitreous degeneration, bilateral: Secondary | ICD-10-CM | POA: Diagnosis not present

## 2021-01-28 DIAGNOSIS — H40013 Open angle with borderline findings, low risk, bilateral: Secondary | ICD-10-CM | POA: Diagnosis not present

## 2021-01-28 DIAGNOSIS — H353122 Nonexudative age-related macular degeneration, left eye, intermediate dry stage: Secondary | ICD-10-CM | POA: Diagnosis not present

## 2021-02-02 ENCOUNTER — Encounter (HOSPITAL_COMMUNITY): Payer: Self-pay | Admitting: *Deleted

## 2021-02-02 ENCOUNTER — Emergency Department (HOSPITAL_COMMUNITY)
Admission: EM | Admit: 2021-02-02 | Discharge: 2021-02-02 | Disposition: A | Discharge status: Home / Self Care | Payer: Medicare PPO | Attending: Emergency Medicine | Admitting: Emergency Medicine

## 2021-02-02 ENCOUNTER — Emergency Department (HOSPITAL_COMMUNITY): Payer: Medicare PPO

## 2021-02-02 DIAGNOSIS — R112 Nausea with vomiting, unspecified: Secondary | ICD-10-CM | POA: Insufficient documentation

## 2021-02-02 DIAGNOSIS — Z85828 Personal history of other malignant neoplasm of skin: Secondary | ICD-10-CM | POA: Diagnosis not present

## 2021-02-02 DIAGNOSIS — I10 Essential (primary) hypertension: Secondary | ICD-10-CM | POA: Diagnosis not present

## 2021-02-02 DIAGNOSIS — R7309 Other abnormal glucose: Secondary | ICD-10-CM | POA: Diagnosis not present

## 2021-02-02 DIAGNOSIS — Z20822 Contact with and (suspected) exposure to covid-19: Secondary | ICD-10-CM | POA: Insufficient documentation

## 2021-02-02 DIAGNOSIS — Z79899 Other long term (current) drug therapy: Secondary | ICD-10-CM | POA: Diagnosis not present

## 2021-02-02 DIAGNOSIS — R519 Headache, unspecified: Secondary | ICD-10-CM | POA: Insufficient documentation

## 2021-02-02 DIAGNOSIS — Z8616 Personal history of COVID-19: Secondary | ICD-10-CM | POA: Insufficient documentation

## 2021-02-02 DIAGNOSIS — R7989 Other specified abnormal findings of blood chemistry: Secondary | ICD-10-CM | POA: Insufficient documentation

## 2021-02-02 DIAGNOSIS — R42 Dizziness and giddiness: Secondary | ICD-10-CM | POA: Insufficient documentation

## 2021-02-02 LAB — BASIC METABOLIC PANEL
Anion gap: 8 (ref 5–15)
BUN: 27 mg/dL — ABNORMAL HIGH (ref 8–23)
CO2: 26 mmol/L (ref 22–32)
Calcium: 8.8 mg/dL — ABNORMAL LOW (ref 8.9–10.3)
Chloride: 104 mmol/L (ref 98–111)
Creatinine, Ser: 1.09 mg/dL (ref 0.61–1.24)
GFR, Estimated: >60 mL/min (ref >60)
Glucose, Bld: 157 mg/dL — ABNORMAL HIGH (ref 70–99)
Potassium: 4.4 mmol/L (ref 3.5–5.1)
Sodium: 138 mmol/L (ref 135–145)

## 2021-02-02 LAB — CBC WITH DIFFERENTIAL/PLATELET
Abs Immature Granulocytes: 0.02 10*3/uL (ref 0.00–0.07)
Basophils Absolute: 0 10*3/uL (ref 0.0–0.1)
Basophils Relative: 0 %
Eosinophils Absolute: 0 10*3/uL (ref 0.0–0.5)
Eosinophils Relative: 1 %
HCT: 43.2 % (ref 39.0–52.0)
Hemoglobin: 14.8 g/dL (ref 13.0–17.0)
Immature Granulocytes: 0 %
Lymphocytes Relative: 14 %
Lymphs Abs: 0.8 10*3/uL (ref 0.7–4.0)
MCH: 31.2 pg (ref 26.0–34.0)
MCHC: 34.3 g/dL (ref 30.0–36.0)
MCV: 91.1 fL (ref 80.0–100.0)
Monocytes Absolute: 0.2 10*3/uL (ref 0.1–1.0)
Monocytes Relative: 4 %
Neutro Abs: 4.2 10*3/uL (ref 1.7–7.7)
Neutrophils Relative %: 81 %
Platelets: 122 10*3/uL — ABNORMAL LOW (ref 150–400)
RBC: 4.74 MIL/uL (ref 4.22–5.81)
RDW: 11.9 % (ref 11.5–15.5)
WBC: 5.3 10*3/uL (ref 4.0–10.5)
nRBC: 0 % (ref 0.0–0.2)

## 2021-02-02 LAB — RESP PANEL BY RT-PCR (FLU A&B, COVID) ARPGX2
Influenza A by PCR: NEGATIVE
Influenza B by PCR: NEGATIVE
SARS Coronavirus 2 by RT PCR: NEGATIVE

## 2021-02-02 MED ORDER — ONDANSETRON 8 MG PO TBDP
8.0000 mg | ORAL_TABLET | Freq: Once | ORAL | Status: AC
  Administered 2021-02-02: 19:00:00 8 mg via ORAL
  Filled 2021-02-02: qty 1

## 2021-02-02 MED ORDER — LORAZEPAM 0.5 MG PO TABS: 0.5000 mg | ORAL_TABLET | Freq: Four times a day (QID) | ORAL | 0 refills | Status: DC | PRN

## 2021-02-02 MED ORDER — LORAZEPAM 1 MG PO TABS
1.0000 mg | ORAL_TABLET | Freq: Once | ORAL | Status: AC
  Administered 2021-02-02: 19:00:00 1 mg via ORAL
  Filled 2021-02-02: qty 1

## 2021-02-02 NOTE — ED Provider Notes (Signed)
[facility] EMERGENCY DEPARTMENT Provider Note   CSN: [id number] Arrival date & time: 02/02/21  1839     History Chief Complaint  Patient presents with   Dizziness    Jose King is a 80 y.o. male.  HPI He presents for evaluation of dizziness which started about 4 PM today.  He describes the dizziness as a spinning sensation.  He denies weakness, paresthesia, difficulty walking.  He is not having fevers, chills, abdominal or back pain.  He has felt nauseated and vomited several times, after onset of the dizziness.  He has noted that he feels pain in his head today.  No prior similar problems.    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 repeat 5 years   COLONOSCOPY  2003   RMR: normal   COLONOSCOPY   1999   RMR: adenomatous ICV   COLONOSCOPY N/A 05/09/2014   Procedure: COLONOSCOPY;  Surgeon: Daneil Dolin, MD;  [facility];  Service: Endoscopy;  Laterality: N/A;  9:15am   COLONOSCOPY N/A 06/09/2019   Procedure: COLONOSCOPY;  Surgeon: Daneil Dolin, MD;  [facility];  Service: Endoscopy;  Laterality: N/A;  9:45am   EYE SURGERY  12/2018   cataract    [place]   POLYPECTOMY  06/09/2019   Procedure: POLYPECTOMY;  Surgeon: Daneil Dolin, MD;  [facility];  Service: Endoscopy;;   PROSTATE SURGERY     TONSILLECTOMY AND ADENOIDECTOMY  1948       Family History  Problem Relation Age of Onset   Cancer Mother 36       colon   Heart attack Father 42   Suicidality Brother    Cancer Brother 15       Pancreatic   Lymphoma Brother    Cancer Other        grandfather, mouth   Cancer Other        grandmother, intestional    Vision loss Paternal Uncle        Massive Stroke    Social History   Tobacco Use   Smoking status: Never   Smokeless  tobacco: Never  Vaping Use   Vaping Use: Never used  Substance Use Topics   Alcohol use: No   Drug use: No    Home Medications Prior to Admission medications   Medication Sig Start Date End Date Taking? Authorizing Provider  LORazepam (ATIVAN) 0.5 MG tablet Take 1 tablet (0.5 mg total) by mouth every 6 (six) hours as needed (Dizziness). 02/02/21  Yes Daleen Bo, MD  amLODipine (NORVASC) 5 MG tablet TAKE 1 TABLET(5 MG) BY MOUTH DAILY 01/17/21   Burchette, Alinda Sierras, MD  atorvastatin (LIPITOR) 20 MG tablet TAKE 1 TABLET(20 MG) BY MOUTH DAILY 11/01/20   Burchette, Alinda Sierras, MD  calcium carbonate (OS-CAL - DOSED IN MG OF ELEMENTAL CALCIUM) 1250 (500 Ca) MG tablet Take 1 tablet by mouth 3 (three) times a week.    [provider]  Multiple Vitamins-Minerals (PRESERVISION AREDS PO) Take 1 tablet by mouth in the morning and at bedtime.     [provider]  polyethylene  glycol-electrolytes (TRILYTE) 420 g solution Take 4,000 mLs by mouth as directed. 05/02/19   Rourk, Cristopher Estimable, MD    Allergies    Patient has no known allergies.  Review of Systems   Review of Systems  All other systems reviewed and are negative.  Physical Exam Updated Vital Signs BP (!) 160/93    Pulse 77    Temp (!) 97.4 F (36.3 C) (Oral)    Resp 14    Ht 5\' 10"  (1.778 m)    Wt 81.6 kg    SpO2 94%    BMI 25.83 kg/m   Physical Exam Vitals and nursing note reviewed.  Constitutional:      General: He is not in acute distress.    Appearance: He is well-developed. He is not ill-appearing, toxic-appearing or diaphoretic.  HENT:     Head: Normocephalic and atraumatic.     Right Ear: External ear normal.     Left Ear: External ear normal.     Nose: No congestion or rhinorrhea.  Eyes:     Conjunctiva/sclera: Conjunctivae normal.     Pupils: Pupils are equal, round, and reactive to light.  Neck:     Trachea: Phonation normal.  Cardiovascular:     Rate and Rhythm: Normal rate and regular rhythm.     Heart sounds: Normal heart sounds.  Pulmonary:     Effort: Pulmonary effort is normal.     Breath sounds: Normal breath sounds.  Abdominal:     Palpations: Abdomen is soft.     Tenderness: There is no abdominal tenderness.  Musculoskeletal:        General: Normal range of motion.     Cervical back: Normal range of motion and neck supple.  Skin:    General: Skin is warm and dry.  Neurological:     Mental Status: He is alert and oriented to person, place, and time.     Cranial Nerves: No cranial nerve deficit.     Sensory: No sensory deficit.     Motor: No abnormal muscle tone.     Coordination: Coordination normal.  Psychiatric:        Mood and Affect: Mood normal.        Behavior: Behavior normal.        Thought Content: Thought content normal.        Judgment: Judgment normal.    ED Results / Procedures / Treatments   Labs (all labs ordered are listed, but only abnormal  results are  displayed) Labs Reviewed  BASIC METABOLIC PANEL - Abnormal; Notable for the following components:      Result Value   Glucose, Bld 157 (*)    BUN 27 (*)    Calcium 8.8 (*)    All other components within normal limits  CBC WITH DIFFERENTIAL/PLATELET - Abnormal; Notable for the following components:   Platelets 122 (*)    All other components within normal limits  RESP PANEL BY RT-PCR (FLU A&B, COVID) [id number]    EKG EKG Interpretation  Date/Time:  Sunday February 02 2021 19:26:23 EST Ventricular Rate:  64 PR Interval:  206 QRS Duration: 104 QT Interval:  418 QTC Calculation: 432 R Axis:   29 Text Interpretation: Sinus rhythm Abnormal R-wave progression, early transition No old tracing to compare Confirmed by Daleen Bo [phone]) on 02/02/2021 8:39:19 PM  Radiology CT Head Wo Contrast  Result Date: 02/02/2021 CLINICAL DATA:  Dizziness and headaches for several hours, initial encounter EXAM: CT HEAD WITHOUT CONTRAST TECHNIQUE: Contiguous axial images were obtained from the base of the skull through the vertex without intravenous contrast. COMPARISON:  09/12/2007 FINDINGS: Brain: No evidence of acute infarction, hemorrhage, hydrocephalus, extra-axial collection or mass lesion/mass effect. Vascular: No hyperdense vessel or unexpected calcification. Skull: Normal. Negative for fracture or focal lesion. Sinuses/Orbits: No acute finding. Other: None. IMPRESSION: No acute abnormality noted. Electronically Signed   By: Inez Catalina M.D.   On: 02/02/2021 20:48    Procedures Procedures   Medications Ordered in ED Medications  LORazepam (ATIVAN) tablet 1 mg (1 mg Oral Given 02/02/21 1926)  ondansetron (ZOFRAN-ODT) disintegrating tablet 8 mg (8 mg Oral Given 02/02/21 1926)    ED Course  I have reviewed the triage vital signs and the nursing notes.  Pertinent labs & imaging results that were available during my care of the patient were reviewed by me and considered in my  medical decision making (see chart for details).    MDM Rules/Calculators/A&P                          Patient Vitals for the past 24 hrs:  BP Temp Temp src Pulse Resp SpO2 Height Weight  02/02/21 2200 (!) 160/93 -- -- 77 14 94 % -- --  02/02/21 2145 -- -- -- 72 14 91 % -- --  02/02/21 2130 (!) 142/71 -- -- 63 14 91 % -- --  02/02/21 2100 (!) 151/75 -- -- 61 13 94 % -- --  02/02/21 2030 (!) 163/83 -- -- 63 15 94 % -- --  02/02/21 2017 -- -- -- 64 13 94 % -- --  02/02/21 2000 (!) 171/91 -- -- 70 16 94 % -- --  02/02/21 1930 (!) 173/83 -- -- 62 16 96 % -- --  02/02/21 1922 (!) 150/85 -- -- 63 15 96 % -- --  02/02/21 1915 -- -- -- 66 18 97 % -- --  02/02/21 1900 -- -- -- 65 15 97 % -- --  02/02/21 1857 -- -- -- (!) 134 14 96 % -- --  02/02/21 1846 (!) 173/94 (!) 97.4 F (36.3 C) Oral 67 -- 98 % 5\' 10"  (1.778 m) 81.6 kg    9:38 PM Reevaluation with update and discussion. After initial assessment and treatment, an updated evaluation reveals he states the dizziness has resolved.  He denies further symptoms.  Findings discussed and questions answered.  Amatory trial done prior to departure. Daleen Bo   Medical Decision Making:  This patient is presenting for evaluation of dizziness and headache, which does require a range of treatment options, and is a complaint that involves a high risk of morbidity and mortality. The differential diagnoses include CVA, peripheral vestibulopathy, metabolic disorder, cardiac disorder. I decided to review old records, and in summary elderly male, presenting with dizziness, a new finding for him.  He has a history of hypertension.  I did not require additional historical information from anyone.  Clinical Laboratory Tests Ordered, included CBC and Metabolic panel. Review indicates normal except glucose high, BUN high. Radiologic Tests Ordered, included CT head.  I independently Visualized: Radiograph images, which show no acute  abnormalities    Critical Interventions-clinical evaluation, laboratory testing, medication treatment, CT imaging, observation and reassessment  After These Interventions, the Patient was reevaluated and was found improved and stable for discharge.  Doubt CVA, metabolic disorder or significant disability.  Possible benign positional vertigo.  No indication for hospitalization at this time.  CRITICAL CARE-no Performed by: Daleen Bo  Nursing Notes Reviewed/ Care Coordinated Applicable Imaging Reviewed Interpretation of Laboratory Data incorporated into ED treatment  The patient appears reasonably screened and/or stabilized for discharge and I doubt any other medical condition or other [facility] requiring further screening, evaluation, or treatment in the ED at this time prior to discharge.  Plan: Home Medications-continue usual; Home Treatments-gradual advance diet and activity; return here if the recommended treatment, does not improve the symptoms; Recommended follow up-PCP, checkup in 3 to 4 days.        Final Clinical Impression(s) / ED Diagnoses Final diagnoses:  Vertigo    Rx / DC Orders ED Discharge Orders          Ordered    LORazepam (ATIVAN) 0.5 MG tablet  Every 6 hours PRN        02/02/21 2208             Daleen Bo, MD 02/02/21 2209

## 2021-02-02 NOTE — ED Notes (Signed)
Pt ambulated with no issues noted.

## 2021-02-02 NOTE — Discharge Instructions (Addendum)
It is not clear what is causing your dizziness.  The testing today did not show any serious problems.  You do not have COVID or influenza.  Make sure you are getting plenty of rest and drink a lot of fluids.  We are prescribing a pain medication to help your discomfort.  It may make you sleepy so do not drive when taking it.

## 2021-02-02 NOTE — ED Triage Notes (Signed)
Dizziness onset 1600 today,

## 2021-02-05 ENCOUNTER — Ambulatory Visit: Payer: Medicare PPO | Admitting: Family Medicine

## 2021-02-05 VITALS — BP 154/86 | HR 76 | Temp 97.7°F | Ht 70.0 in | Wt 189.8 lb

## 2021-02-05 DIAGNOSIS — I10 Essential (primary) hypertension: Secondary | ICD-10-CM

## 2021-02-05 DIAGNOSIS — R42 Dizziness and giddiness: Secondary | ICD-10-CM

## 2021-02-05 MED ORDER — LOSARTAN POTASSIUM 50 MG PO TABS: 50.0000 mg | ORAL_TABLET | Freq: Every day | ORAL | 3 refills | Status: DC

## 2021-02-05 NOTE — Progress Notes (Signed)
Established Patient Office Visit  Subjective:  Patient ID: Jose King, male    DOB: 07-Aug-1940  Age: 80 y.o. MRN: [id number]  CC:  Chief Complaint  Patient presents with   Hospitalization Follow-up    On Sunday pt had dizziness, nausea, sweating, BP was 179/105 and pt fell out. Pt went to ED.     HPI [name] presents for ER follow-up and recent elevated blood pressures.  He was seen in the ER on 02/02/2021 which is a Sunday.  He got up that day and went to church and felt fine and had no difficulties until around 4 PM that day.  He went down his basement and stoop down and when standing up felt some dizziness which she describes a spinning sensation.  No syncope.  Denied any focal weakness, speech changes, difficulty swallowing, ataxia, chest pain.  He denied any recent nasal congestion.  No tinnitus.  No sudden hearing changes.  Did have some nausea and a couple episodes of vomiting.  On arrival to ER had elevated blood pressures.  CT head showed no acute findings.  His respiratory panel was negative for influenza and COVID.  CBC revealed chronic low platelets but stable.  Normal white count..  Electrolytes stable.  Glucose 157 but this was nonfasting.  Patient's symptoms had resolved the time he left the ER.  He was given some Zofran and also lorazepam and has not actually taken any.  Denies any prior history of vertigo.  He has hypertension currently treated with Norvasc 5 mg daily.  He states he had several elevated readings recently with average readings around 155/86.  No peripheral edema.  No regular alcohol use.  Has taken some recent relief.  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 repeat 5 years   COLONOSCOPY  2003   RMR: normal   COLONOSCOPY  1999   RMR: adenomatous ICV   COLONOSCOPY N/A 05/09/2014   Procedure: COLONOSCOPY;  Surgeon: Daneil Dolin, MD;  [facility];  Service: Endoscopy;  Laterality: N/A;  9:15am   COLONOSCOPY N/A 06/09/2019   Procedure: COLONOSCOPY;  Surgeon: Daneil Dolin, MD;  Location: [facility];  Service: Endoscopy;  Laterality: N/A;  9:45am   EYE SURGERY  12/2018   cataract    INGUINAL HERNIA REPAIR  1985   POLYPECTOMY  06/09/2019   Procedure: POLYPECTOMY;  Surgeon: Daneil Dolin, MD;  Location: [facility];  Service: Endoscopy;;   PROSTATE SURGERY  TONSILLECTOMY AND ADENOIDECTOMY  1948    Family History  Problem Relation Age of Onset   Cancer Mother 34       colon   Heart attack Father 31   Suicidality Brother    Cancer Brother 76       Pancreatic   Lymphoma Brother    Cancer Other        grandfather, mouth   Cancer Other        grandmother, intestional    Vision loss Paternal Uncle        Massive Stroke    Social History   Socioeconomic History   Marital status: Married    Spouse name: Not on file   Number of children: 0   Years of education: Not on file   Highest education level: Master's degree (e.g., MA, MS, MEng, MEd, MSW, MBA)  Occupational History   Occupation: Retired  Tobacco Use   Smoking status: Never    Smokeless tobacco: Never  Vaping Use   Vaping Use: Never used  Substance and Sexual Activity   Alcohol use: No   Drug use: No   Sexual activity: Not on file  Other Topics Concern   Not on file  Social History Narrative   Married   Gets regular exercise from farm work   Social Determinants of Radio broadcast assistant Strain: Low Risk    Difficulty of Paying Living Expenses: Not hard at all  Food Insecurity: No Food Insecurity   Worried About Charity fundraiser in the Last Year: Never true   Arboriculturist in the Last Year: Never true  Transportation Needs: No Transportation Needs   Lack of Transportation (Medical): No   Lack of Transportation (Non-Medical): No  Physical Activity: Insufficiently Active   Days of Exercise per Week: 3 days   Minutes of Exercise per Session: 30 min  Stress: No Stress Concern Present   Feeling of Stress : Not at all  Social Connections: Socially Integrated   Frequency of Communication with Friends and Family: Twice a week   Frequency of Social Gatherings with Friends and Family: More than three times a week   Attends Religious Services: More than 4 times per year   Active Member of [employer] or Organizations: Yes   Attends Archivist Meetings: Never   Marital Status: Married  Human resources officer Violence: Not At Risk   Fear of Current or Ex-Partner: No   Emotionally Abused: No   Physically Abused: No   Sexually Abused: No    Outpatient Medications Prior to Visit  Medication Sig Dispense Refill   amLODipine (NORVASC) 5 MG tablet TAKE 1 TABLET(5 MG) BY MOUTH DAILY 90 tablet 0   atorvastatin (LIPITOR) 20 MG tablet TAKE 1 TABLET(20 MG) BY MOUTH DAILY 90 tablet 3   calcium carbonate (OS-CAL - DOSED IN MG OF ELEMENTAL CALCIUM) 1250 (500 Ca) MG tablet Take 1 tablet by mouth 3 (three) times a week.     LORazepam (ATIVAN) 0.5 MG tablet Take 1 tablet (0.5 mg total) by mouth every 6 (six) hours as needed (Dizziness). 15 tablet 0   Multiple  Vitamins-Minerals (PRESERVISION AREDS PO) Take 1 tablet by mouth in the morning and at bedtime.      Naproxen Sodium (ALEVE PO) Take by mouth.     polyethylene glycol-electrolytes (TRILYTE) 420 g solution Take 4,000 mLs by mouth as directed. 4000 mL 0   No facility-administered medications prior to visit.    No Known  Allergies  ROS Review of Systems  Constitutional:  Negative for chills and fever.  Eyes:  Negative for visual disturbance.  Respiratory:  Negative for cough and shortness of breath.   Cardiovascular:  Negative for chest pain, palpitations and leg swelling.  Gastrointestinal:  Negative for abdominal pain.  Genitourinary:  Negative for dysuria.  Neurological:  Positive for dizziness. Negative for seizures, syncope, speech difficulty and weakness.  Psychiatric/Behavioral:  Negative for confusion.      Objective:    Physical Exam Vitals reviewed.  Constitutional:      Appearance: Normal appearance.  HENT:     Right Ear: Tympanic membrane normal.     Left Ear: Tympanic membrane normal.  Neck:     Comments: No carotid bruits Cardiovascular:     Rate and Rhythm: Normal rate and regular rhythm.  Pulmonary:     Effort: Pulmonary effort is normal.     Breath sounds: Normal breath sounds. No wheezing or rales.  Musculoskeletal:     Cervical back: Neck supple.     Right lower leg: No edema.     Left lower leg: No edema.  Neurological:     General: No focal deficit present.     Mental Status: He is alert and oriented to person, place, and time.     Cranial Nerves: No cranial nerve deficit.     Motor: No weakness.     Gait: Gait normal.    BP (!) 154/86 (BP Location: Left Arm, Cuff Size: Normal)    Pulse 76    Temp 97.7 F (36.5 C) (Oral)    Ht 5\' 10"  (1.778 m)    Wt 189 lb 12.8 oz (86.1 kg)    SpO2 97%    BMI 27.23 kg/m  Wt Readings from Last 3 Encounters:  02/05/21 189 lb 12.8 oz (86.1 kg)  02/02/21 180 lb (81.6 kg)  11/28/20 190 lb 2 oz (86.2 kg)      Health Maintenance Due  Topic Date Due   Pneumonia Vaccine 63+ Years old (2 - PPSV23 if available, else PCV20) 12/14/2014    There are no preventive care reminders to display for this patient.  Lab Results  Component Value Date   TSH 2.98 03/28/2018   Lab Results  Component Value Date   WBC 5.3 02/02/2021   HGB 14.8 02/02/2021   HCT 43.2 02/02/2021   MCV 91.1 02/02/2021   PLT 122 (L) 02/02/2021   Lab Results  Component Value Date   NA 138 02/02/2021   K 4.4 02/02/2021   CO2 26 02/02/2021   GLUCOSE 157 (H) 02/02/2021   BUN 27 (H) 02/02/2021   CREATININE 1.09 02/02/2021   BILITOT 0.7 05/21/2020   ALKPHOS 93 05/21/2020   AST 19 05/21/2020   ALT 23 05/21/2020   PROT 6.4 05/21/2020   ALBUMIN 4.3 05/21/2020   CALCIUM 8.8 (L) 02/02/2021   ANIONGAP 8 02/02/2021   GFR 68.24 05/21/2020   Lab Results  Component Value Date   CHOL 145 05/21/2020   Lab Results  Component Value Date   HDL 48.50 05/21/2020   Lab Results  Component Value Date   LDLCALC 78 05/21/2020   Lab Results  Component Value Date   TRIG 94.0 05/21/2020   Lab Results  Component Value Date   CHOLHDL 3 05/21/2020   No results found for: HGBA1C    Assessment & Plan:   #1 vertigo.  Symptoms were very transient.  He had work-up as above with unrevealing lab work  and CT unremarkable.  Did not have any red flags such as severe headache, sudden hearing loss, pulsatile tinnitus, speech difficulties, ataxia, etc.  -Observation for now.  Follow-up promptly for any recurrent symptoms.  #2 hypertension suboptimally controlled.  Continue amlodipine 5 mg daily.  Add losartan 50 mg once daily.  Bring back within 6 to 8 weeks to reassess.  Try to keep sodium intake less than 2500 mg daily.  Try to avoid regular use of nonsteroidals.  Meds ordered this encounter  Medications   losartan (COZAAR) 50 MG tablet    Sig: Take 1 tablet (50 mg total) by mouth daily.    Dispense:  90 tablet    Refill:  3     Follow-up: Return in about 2 months (around 04/08/2021).    Carolann Littler, MD

## 2021-02-19 ENCOUNTER — Ambulatory Visit: Payer: Medicare PPO | Admitting: Dermatology

## 2021-02-26 ENCOUNTER — Encounter: Payer: Self-pay | Admitting: Dermatology

## 2021-02-26 ENCOUNTER — Other Ambulatory Visit: Payer: Self-pay

## 2021-02-26 ENCOUNTER — Ambulatory Visit: Payer: Medicare PPO | Admitting: Dermatology

## 2021-02-26 DIAGNOSIS — Z1283 Encounter for screening for malignant neoplasm of skin: Secondary | ICD-10-CM

## 2021-02-26 DIAGNOSIS — L570 Actinic keratosis: Secondary | ICD-10-CM | POA: Diagnosis not present

## 2021-03-10 ENCOUNTER — Telehealth: Payer: Self-pay | Admitting: Family Medicine

## 2021-03-10 NOTE — Telephone Encounter (Signed)
Left message for patient to call back and schedule Medicare Annual Wellness Visit (AWV) either virtually or in office. Left  my Herbie Drape number [phone]   Last AWV 04/10/20 please schedule at anytime with [facility] Nurse Health Advisor 1 or 2  Awv can be scheduled calendar year Mcarthur Rossetti  This should be a 45 minute visit.

## 2021-03-13 ENCOUNTER — Ambulatory Visit (INDEPENDENT_AMBULATORY_CARE_PROVIDER_SITE_OTHER): Payer: Medicare PPO

## 2021-03-13 VITALS — BP 144/77 | Ht 70.0 in | Wt 189.0 lb

## 2021-03-13 DIAGNOSIS — Z0000 Encounter for general adult medical examination without abnormal findings: Secondary | ICD-10-CM

## 2021-03-13 NOTE — Patient Instructions (Addendum)
Jose King , Thank you for taking time to come for your Medicare Wellness Visit. I appreciate your ongoing commitment to your health goals. Please review the following plan we discussed and let me know if I can assist you in the future.   These are the goals we discussed:  Goals      DIET - REDUCE SUGAR INTAKE     Cut back on sweets; jellies; cake and pies.       Patient Stated     01/16/2019 need for developing a routine exercise plan and decreasing intake of sweets.      Patient Stated     I want to maintain my independence and travel more!      Reduce sugar intake to X grams per day     Cut back on sweets and eat more          This is a list of the screening recommended for you and due dates:  Health Maintenance  Topic Date Due   Tetanus Vaccine  06/18/2025   Pneumonia Vaccine  Completed   Flu Shot  Completed   COVID-19 Vaccine  Completed   Zoster (Shingles) Vaccine  Completed   HPV Vaccine  Aged Out   Advanced directives: Yes  Conditions/risks identified: None  Next appointment: Follow up in one year for your annual wellness visit.   Preventive Care 84 Years and Older, Male Preventive care refers to lifestyle choices and visits with your health care provider that can promote health and wellness. What does preventive care include? A yearly physical exam. This is also called an annual well check. Dental exams once or twice a year. Routine eye exams. Ask your health care provider how often you should have your eyes checked. Personal lifestyle choices, including: Daily care of your teeth and gums. Regular physical activity. Eating a healthy diet. Avoiding tobacco and drug use. Limiting alcohol use. Practicing safe sex. Taking low doses of aspirin every day. Taking vitamin and mineral supplements as recommended by your health care provider. What happens during an annual well check? The services and screenings done by your health care provider during your annual  well check will depend on your age, overall health, lifestyle risk factors, and family history of disease. Counseling  Your health care provider may ask you questions about your: Alcohol use. Tobacco use. Drug use. Emotional well-being. Home and relationship well-being. Sexual activity. Eating habits. History of falls. Memory and ability to understand (cognition). Work and work Statistician. Screening  You may have the following tests or measurements: Height, weight, and BMI. Blood pressure. Lipid and cholesterol levels. These may be checked every 5 years, or more frequently if you are over 5 years old. Skin check. Lung cancer screening. You may have this screening every year starting at age 60 if you have a 30-pack-year history of smoking and currently smoke or have quit within the past 15 years. Fecal occult blood test (FOBT) of the stool. You may have this test every year starting at age 55. Flexible sigmoidoscopy or colonoscopy. You may have a sigmoidoscopy every 5 years or a colonoscopy every 10 years starting at age 80. Prostate cancer screening. Recommendations will vary depending on your family history and other risks. Hepatitis C blood test. Hepatitis B blood test. Sexually transmitted disease (STD) testing. Diabetes screening. This is done by checking your blood sugar (glucose) after you have not eaten for a while (fasting). You may have this done every 1-3 years. Abdominal aortic aneurysm (AAA)  screening. You may need this if you are a current or former smoker. Osteoporosis. You may be screened starting at age 87 if you are at high risk. Talk with your health care provider about your test results, treatment options, and if necessary, the need for more tests. Vaccines  Your health care provider may recommend certain vaccines, such as: Influenza vaccine. This is recommended every year. Tetanus, diphtheria, and acellular pertussis (Tdap, Td) vaccine. You may need a Td booster  every 10 years. Zoster vaccine. You may need this after age 19. Pneumococcal 13-valent conjugate (PCV13) vaccine. One dose is recommended after age 32. Pneumococcal polysaccharide (PPSV23) vaccine. One dose is recommended after age 67. Talk to your health care provider about which screenings and vaccines you need and how often you need them. This information is not intended to replace advice given to you by your health care provider. Make sure you discuss any questions you have with your health care provider. Document Released: 03/01/2015 Document Revised: 10/23/2015 Document Reviewed: 12/04/2014 Elsevier Interactive Patient Education  2017 [place] Prevention in the Home Falls can cause injuries. They can happen to people of all ages. There are many things you can do to make your home safe and to help prevent falls. What can I do on the outside of my home? Regularly fix the edges of walkways and driveways and fix any cracks. Remove anything that might make you trip as you walk through a door, such as a raised step or threshold. Trim any bushes or trees on the path to your home. Use bright outdoor lighting. Clear any walking paths of anything that might make someone trip, such as rocks or tools. Regularly check to see if handrails are loose or broken. Make sure that both sides of any steps have handrails. Any raised decks and porches should have guardrails on the edges. Have any leaves, snow, or ice cleared regularly. Use sand or salt on walking paths during winter. Clean up any spills in your garage right away. This includes oil or grease spills. What can I do in the bathroom? Use night lights. Install grab bars by the toilet and in the tub and shower. Do not use towel bars as grab bars. Use non-skid mats or decals in the tub or shower. If you need to sit down in the shower, use a plastic, non-slip stool. Keep the floor dry. Clean up any water that spills on the floor as soon  as it happens. Remove soap buildup in the tub or shower regularly. Attach bath mats securely with double-sided non-slip rug tape. Do not have throw rugs and other things on the floor that can make you trip. What can I do in the bedroom? Use night lights. Make sure that you have a light by your bed that is easy to reach. Do not use any sheets or blankets that are too big for your bed. They should not hang down onto the floor. Have a firm chair that has side arms. You can use this for support while you get dressed. Do not have throw rugs and other things on the floor that can make you trip. What can I do in the kitchen? Clean up any spills right away. Avoid walking on wet floors. Keep items that you use a lot in easy-to-reach places. If you need to reach something above you, use a strong step stool that has a grab bar. Keep electrical cords out of the way. Do not use floor polish or  wax that makes floors slippery. If you must use wax, use non-skid floor wax. Do not have throw rugs and other things on the floor that can make you trip. What can I do with my stairs? Do not leave any items on the stairs. Make sure that there are handrails on both sides of the stairs and use them. Fix handrails that are broken or loose. Make sure that handrails are as long as the stairways. Check any carpeting to make sure that it is firmly attached to the stairs. Fix any carpet that is loose or worn. Avoid having throw rugs at the top or bottom of the stairs. If you do have throw rugs, attach them to the floor with carpet tape. Make sure that you have a light switch at the top of the stairs and the bottom of the stairs. If you do not have them, ask someone to add them for you. What else can I do to help prevent falls? Wear shoes that: Do not have high heels. Have rubber bottoms. Are comfortable and fit you well. Are closed at the toe. Do not wear sandals. If you use a stepladder: Make sure that it is fully  opened. Do not climb a closed stepladder. Make sure that both sides of the stepladder are locked into place. Ask someone to hold it for you, if possible. Clearly mark and make sure that you can see: Any grab bars or handrails. First and last steps. Where the edge of each step is. Use tools that help you move around (mobility aids) if they are needed. These include: Canes. Walkers. Scooters. Crutches. Turn on the lights when you go into a dark area. Replace any light bulbs as soon as they burn out. Set up your furniture so you have a clear path. Avoid moving your furniture around. If any of your floors are uneven, fix them. If there are any pets around you, be aware of where they are. Review your medicines with your doctor. Some medicines can make you feel dizzy. This can increase your chance of falling. Ask your doctor what other things that you can do to help prevent falls. This information is not intended to replace advice given to you by your health care provider. Make sure you discuss any questions you have with your health care provider. Document Released: 11/29/2008 Document Revised: 07/11/2015 Document Reviewed: 03/09/2014 Elsevier Interactive Patient Education  2017 [employer].

## 2021-03-13 NOTE — Progress Notes (Signed)
Subjective:   Jose King is a 82 y.o. male who presents for Medicare Annual/Subsequent preventive examination.  Review of Systems    No ROS Cardiac Risk Factors include: hypertension;advanced age (>69men, >58 women);male gender    Objective:    Today's Vitals   03/13/21 0850  BP: (!) 144/77  Weight: 189 lb (85.7 kg)  Height: 5\' 10"  (1.778 m)   Body mass index is 27.12 kg/m.  Advanced Directives 03/13/2021 02/02/2021 04/10/2020 06/09/2019 01/16/2019 03/08/2017 06/19/2016  Does Patient Have a Medical Advance Directive? Yes No Yes Yes Yes No No  Type of Advance Directive Living will - [place];Living will [place];Living will Living will - -  Does patient want to make changes to medical advance directive? No - Patient declined - No - Patient declined - - - -  Copy of [place] in Chart? - - No - copy requested No - copy requested - - -  Would patient like information on creating a medical advance directive? - No - Patient declined - - - - -    Current Medications (verified) Outpatient Encounter Medications as of 03/13/2021  Medication Sig   amLODipine (NORVASC) 5 MG tablet TAKE 1 TABLET(5 MG) BY MOUTH DAILY   atorvastatin (LIPITOR) 20 MG tablet TAKE 1 TABLET(20 MG) BY MOUTH DAILY   calcium carbonate (OS-CAL - DOSED IN MG OF ELEMENTAL CALCIUM) 1250 (500 Ca) MG tablet Take 1 tablet by mouth 3 (three) times a week.   LORazepam (ATIVAN) 0.5 MG tablet Take 1 tablet (0.5 mg total) by mouth every 6 (six) hours as needed (Dizziness).   losartan (COZAAR) 50 MG tablet Take 1 tablet (50 mg total) by mouth daily.   Multiple Vitamins-Minerals (PRESERVISION AREDS PO) Take 1 tablet by mouth in the morning and at bedtime.    Naproxen Sodium (ALEVE PO) Take by mouth.   polyethylene glycol-electrolytes (TRILYTE) 420 g solution Take 4,000 mLs by mouth as directed.   No facility-administered encounter medications on file as of 03/13/2021.     Allergies (verified) Patient has no known allergies.   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 repeat 5 years   COLONOSCOPY  2003   RMR: normal   COLONOSCOPY  1999   RMR: adenomatous ICV   COLONOSCOPY N/A 05/09/2014   Procedure: COLONOSCOPY;  Surgeon: Daneil Dolin, MD;  [facility];  Service: Endoscopy;  Laterality: N/A;  9:15am   COLONOSCOPY N/A 06/09/2019   Procedure:  COLONOSCOPY;  Surgeon: Daneil Dolin, MD;  [facility];  Service: Endoscopy;  Laterality: N/A;  9:45am   EYE SURGERY  12/2018   cataract    INGUINAL HERNIA REPAIR  1985   POLYPECTOMY  06/09/2019   Procedure: POLYPECTOMY;  Surgeon: Daneil Dolin, MD;  [facility];  Service: Endoscopy;;   PROSTATE SURGERY     TONSILLECTOMY AND ADENOIDECTOMY  1948   Family History  Problem Relation Age of Onset   Cancer Mother 71       colon   Heart attack Father 4   Suicidality Brother    Cancer Brother 101       Pancreatic   Lymphoma Brother    Cancer Other        grandfather, mouth   Cancer Other        grandmother, intestional     Vision loss Paternal Uncle        Massive Stroke   Social History   Socioeconomic History   Marital status: Married    Spouse name: Not on file   Number of children: 0   Years of education: Not on file   Highest education level: Master's degree (e.g., MA, MS, MEng, MEd, MSW, MBA)  Occupational History   Occupation: Retired  Tobacco Use   Smoking status: Never   Smokeless tobacco: Never  Vaping Use   Vaping Use: Never used  Substance and Sexual Activity   Alcohol use: No   Drug use: No   Sexual activity: Not on file  Other Topics Concern   Not on file  Social History Narrative   Married   Gets regular exercise from farm work   Social Determinants of Radio broadcast assistant Strain: Low Risk    Difficulty of Paying Living Expenses: Not hard at all  Food Insecurity: No Food Insecurity   Worried About Charity fundraiser in the Last Year: Never true   Arboriculturist in the Last Year: Never true  Transportation Needs: No Transportation Needs   Lack of Transportation (Medical): No   Lack of Transportation (Non-Medical): No  Physical Activity: Sufficiently Active   Days of Exercise per Week: 5 days   Minutes of Exercise per Session: 30 min  Stress: No Stress Concern Present   Feeling of Stress : Not at all  Social Connections: Socially Integrated   Frequency of Communication with Friends and Family: More than three times a week   Frequency of Social Gatherings with Friends and Family: More than three times a week   Attends Religious Services: More than 4 times per year   Active Member of [employer] or Organizations: Yes   Attends Music therapist: More than 4 times per year   Marital Status: Married    Clinical Intake:  Diabetic? No  Interpreter Needed?: NoActivities of Daily Living In your present state of health, do you have any difficulty performing the following activities: 03/13/2021 04/10/2020  Hearing? 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 N  N  Difficulty concentrating or making decisions? N N  Walking or climbing stairs? N N  Dressing or bathing? N N  Doing errands, shopping? N N  Preparing Food and eating ? N N  Using the Toilet? N N  In the past six months, have you accidently leaked urine? N N  Do you have problems with loss of bowel control? N N  Managing your Medications? N N  Managing your Finances? N N  Housekeeping or managing your Housekeeping? N N  Some recent data might be hidden    Patient Care Team: Eulas Post, MD as PCP - General (Family Medicine) Gala Romney Cristopher Estimable, MD as Consulting Physician (Gastroenterology) Lavonna Monarch, MD as Consulting Physician (Dermatology)  Indicate any recent Medical Services you may have received from other than [facility] providers in the past year (date may be approximate).     Assessment:   This is a routine wellness examination for Gurpreet.  Virtual Visit via Telephone Note  I connected with  Jose King on 03/13/21 at  9:00 AM EST by telephone and verified that I am speaking with the correct person using two identifiers.  Location: Patient: Home Provider: Office Persons participating in the virtual visit: patient/Nurse Health Advisor   I discussed the limitations, risks, security and privacy concerns of performing an evaluation and management service by telephone and the availability of in person appointments. The patient expressed understanding and agreed to proceed.  Interactive audio and video telecommunications were attempted between this nurse and patient, however failed, due to patient having technical difficulties OR patient did not have access to video capability.  We continued and completed visit with audio only.  Some vital signs may be absent or patient reported.   Criselda Peaches, LPN   Hearing/Vision screen Hearing Screening - Comments:: Patient deaf  left ear. Followed by [employer] Screening - Comments:: Wears reading  glasses. Patient had cataract surgery.  Dietary issues and exercise activities discussed: Current Exercise Habits: The patient does not participate in regular exercise at present, Exercise limited by: None identified   Goals Addressed             This Visit's Progress    DIET - REDUCE SUGAR INTAKE       Cut back on sweets; jellies; cake and pies.         Depression Screen PHQ 2/9 Scores 03/13/2021 02/05/2021 04/10/2020 01/16/2019 12/22/2017 07/14/2017 06/19/2016  PHQ - 2 Score 0 0 0 0 0 0 0    Fall Risk Fall Risk  03/13/2021 02/05/2021 02/05/2021 04/10/2020 01/16/2019  Falls in the past year? 0 0 0 0 0  Number falls in past yr: 0 - - 0 -  Injury with Fall? 0 - - 0 -  Risk for fall due to : - - - No Fall Risks Medication side effect  Follow up - - - Falls evaluation completed;Falls prevention discussed Falls evaluation completed;Education provided;Falls prevention discussed    FALL RISK PREVENTION PERTAINING TO THE HOME:  Any stairs in or around the home? Yes  If so, are there any without handrails? No  Home free of loose throw rugs in walkways, pet beds, electrical cords, etc? Yes  Adequate lighting in your home to reduce risk of falls? Yes   ASSISTIVE DEVICES UTILIZED TO PREVENT FALLS:  Life alert? No  Use of a cane, walker or w/c? No  Grab bars in the bathroom? Yes  Shower chair or bench in shower? Yes  Elevated toilet seat or a handicapped toilet? No   TIMED UP AND GO:  Was the test performed? No . Audio Visit   Cognitive Function: MMSE - Mini Mental State Exam 12/22/2017 06/19/2016  Not completed: (No Data) (No Data)     6CIT Screen 03/13/2021 06/19/2016  What Year? 0 points 0 points  What month? 0 points 0 points  What time? 0 points 0 points  Count back  from 20 0 points 0 points  Months in reverse 0 points 0 points  Repeat phrase 0 points 0 points  Total Score 0 0    Immunizations Immunization History  Administered Date(s) Administered   Fluad Quad(high  Dose 65+) 11/18/2020   Influenza, High Dose Seasonal PF 11/18/2017, 11/02/2018   Influenza-Unspecified 11/14/2016, 12/01/2019   PFIZER(Purple Top)SARS-COV-2 Vaccination 03/03/2019, 03/24/2019, 10/19/2019   Pfizer Covid-19 Vaccine Bivalent Booster 46yrs & up 11/18/2020   Pneumococcal Conjugate-13 12/13/2013   Tdap 11/14/2013, 06/19/2015   Zoster Recombinat (Shingrix) 06/26/2016, 11/15/2016      Flu Vaccine status: Up to date  Pneumococcal vaccine status: Up to date  Covid-19 vaccine status: Completed vaccines  Qualifies for Shingles Vaccine? Yes   Zostavax completed Yes   Shingrix Completed?: Yes  Screening Tests Health Maintenance  Topic Date Due   TETANUS/TDAP  06/18/2025   Pneumonia Vaccine 24+ Years old  Completed   INFLUENZA VACCINE  Completed   COVID-19 Vaccine  Completed   Zoster Vaccines- Shingrix  Completed   HPV VACCINES  Aged Out    Health Maintenance  There are no preventive care reminders to display for this patient.   Additional Screening:   Vision Screening: Recommended annual ophthalmology exams for early detection of glaucoma and other disorders of the eye. Is the patient up to date with their annual eye exam?  Yes  Who is the provider or what is the name of the office in which the patient attends annual eye exams? Followed by Dr Brigitte Pulse  Dental Screening: Recommended annual dental exams for proper oral hygiene  Community Resource Referral / Chronic Care Management:  CRR required this visit?  No   CCM required this visit?  No      Plan:     I have personally reviewed and noted the following in the patients chart:   Medical and social history Use of alcohol, tobacco or illicit drugs  Current medications and supplements including opioid prescriptions. Patient is not currently taking opioid prescriptions. Functional ability and status Nutritional status Physical activity Advanced directives List of other physicians Hospitalizations,  surgeries, and ER visits in previous 12 months Vitals Screenings to include cognitive, depression, and falls Referrals and appointments  In addition, I have reviewed and discussed with patient certain preventive protocols, quality metrics, and best practice recommendations. A written personalized care plan for preventive services as well as general preventive health recommendations were provided to patient.     Criselda Peaches, LPN   04/13/3333

## 2021-03-21 ENCOUNTER — Encounter: Payer: Self-pay | Admitting: Dermatology

## 2021-03-21 NOTE — Progress Notes (Signed)
° °  Follow-Up Visit   Subjective  Jose King is a 81 y.o. male who presents for the following: Annual Exam (No concerns just yearly check).  General skin examination, several new growths on face Location:  Duration:  Quality:  Associated Signs/Symptoms: Modifying Factors:  Severity:  Timing: Context:   Objective  Well appearing patient in no apparent distress; mood and affect are within normal limits. Scalp Waist up skin examination.  No atypical nevi or signs of NMSC noted at the time of the visit.  Large keratoses mid back and many on the body. Upper body skin check   Left Malar Cheek (2), Right Buccal Cheek Gritty pink 3 mm breast    All skin waist up examined.   Assessment & Plan    Screening exam for skin cancer Scalp  Annual skin examination, encouraged to self examine twice annually.  AK (actinic keratosis) (3) Left Malar Cheek (2); Right Buccal Cheek  Destruction of lesion - Left Malar Cheek, Right Buccal Cheek Complexity: simple   Destruction method: cryotherapy   Informed consent: discussed and consent obtained   Timeout:  patient name, date of birth, surgical site, and procedure verified Lesion destroyed using liquid nitrogen: Yes   Cryotherapy cycles:  3 Outcome: patient tolerated procedure well with no complications   Post-procedure details: wound care instructions given        I, Lavonna Monarch, MD, have reviewed all documentation for this visit.  The documentation on 03/21/21 for the exam, diagnosis, procedures, and orders are all accurate and complete.

## 2021-04-01 DIAGNOSIS — Z961 Presence of intraocular lens: Secondary | ICD-10-CM | POA: Diagnosis not present

## 2021-04-01 DIAGNOSIS — H40013 Open angle with borderline findings, low risk, bilateral: Secondary | ICD-10-CM | POA: Diagnosis not present

## 2021-04-01 DIAGNOSIS — Z9841 Cataract extraction status, right eye: Secondary | ICD-10-CM | POA: Diagnosis not present

## 2021-04-01 DIAGNOSIS — H35373 Puckering of macula, bilateral: Secondary | ICD-10-CM | POA: Diagnosis not present

## 2021-04-01 DIAGNOSIS — H353122 Nonexudative age-related macular degeneration, left eye, intermediate dry stage: Secondary | ICD-10-CM | POA: Diagnosis not present

## 2021-04-01 DIAGNOSIS — Z9842 Cataract extraction status, left eye: Secondary | ICD-10-CM | POA: Diagnosis not present

## 2021-04-01 DIAGNOSIS — H43813 Vitreous degeneration, bilateral: Secondary | ICD-10-CM | POA: Diagnosis not present

## 2021-04-01 DIAGNOSIS — H353211 Exudative age-related macular degeneration, right eye, with active choroidal neovascularization: Secondary | ICD-10-CM | POA: Diagnosis not present

## 2021-04-07 ENCOUNTER — Other Ambulatory Visit: Payer: Self-pay | Admitting: Family Medicine

## 2021-04-09 ENCOUNTER — Ambulatory Visit: Payer: Medicare PPO | Admitting: Family Medicine

## 2021-04-09 ENCOUNTER — Encounter: Payer: Self-pay | Admitting: Family Medicine

## 2021-04-09 VITALS — BP 146/80 | HR 94 | Temp 98.2°F | Ht 70.0 in | Wt 196.2 lb

## 2021-04-09 DIAGNOSIS — I10 Essential (primary) hypertension: Secondary | ICD-10-CM

## 2021-04-09 DIAGNOSIS — E785 Hyperlipidemia, unspecified: Secondary | ICD-10-CM

## 2021-04-09 MED ORDER — AMLODIPINE BESYLATE 10 MG PO TABS: 10.0000 mg | ORAL_TABLET | Freq: Every day | ORAL | 3 refills | Status: DC

## 2021-04-09 NOTE — Patient Instructions (Signed)
Set up physical for next follow up in the next few months.

## 2021-04-09 NOTE — Progress Notes (Signed)
Established Patient Office Visit  Subjective:  Patient ID: Jose King, male    DOB: Mar 22, 1940  Age: 81 y.o. MRN: [id number]  CC:  Chief Complaint  Patient presents with   Follow-up    2 mo/ refill on amlodipine    HPI Jose King presents for hypertension follow-up.  Refer to previous visit for details.  He had flare of vertigo was worked up in the ER.  No vertigo symptoms since then.  His blood pressure was elevated at that time and we initiated losartan 50 mg which she is taking regularly along with amlodipine 5 mg.  Blood pressures at home have been 1 low of 123/76 with a high of 160/90 but his average is generally 664-403 systolic and around 80 diastolic.  No headache.  No dizziness.  Stays quite active.  No peripheral edema.  Hyperlipidemia treated with atorvastatin.  Denies any myalgias.  Lipids were last checked in April.  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 repeat 5 years   COLONOSCOPY  2003   RMR: normal   COLONOSCOPY  1999   RMR: adenomatous ICV   COLONOSCOPY N/A  05/09/2014   Procedure: COLONOSCOPY;  Surgeon: Daneil Dolin, MD;  [facility];  Service: Endoscopy;  Laterality: N/A;  9:15am   COLONOSCOPY N/A 06/09/2019   Procedure: COLONOSCOPY;  Surgeon: Daneil Dolin, MD;  [facility];  Service: Endoscopy;  Laterality: N/A;  9:45am   EYE SURGERY  12/2018   cataract    [place]   POLYPECTOMY  06/09/2019   Procedure: POLYPECTOMY;  Surgeon: Daneil Dolin, MD;  [facility];  Service: Endoscopy;;   PROSTATE SURGERY     TONSILLECTOMY AND ADENOIDECTOMY  1948    Family History  Problem Relation Age of Onset   Cancer Mother 12       colon   Heart attack Father 30   Suicidality Brother    Cancer Brother 64       Pancreatic   Lymphoma Brother    Cancer Other        grandfather, mouth   Cancer Other        grandmother, intestional    Vision loss Paternal Uncle        Massive Stroke    Social History   Socioeconomic History   Marital status: Married    Spouse name: Not on file   Number  of children: 0   Years of education: Not on file   Highest education level: Master's degree (e.g., MA, MS, MEng, MEd, MSW, MBA)  Occupational History   Occupation: Retired  Tobacco Use   Smoking status: Never   Smokeless tobacco: Never  Vaping Use   Vaping Use: Never used  Substance and Sexual Activity   Alcohol use: No   Drug use: No   Sexual activity: Not on file  Other Topics Concern   Not on file  Social History Narrative   Married   Gets regular exercise from farm work   Social Determinants of Radio broadcast assistant Strain: Low Risk    Difficulty of Paying Living Expenses: Not hard at all  Food Insecurity: No Food Insecurity   Worried About Charity fundraiser in the Last Year: Never true   Arboriculturist in the Last Year: Never true  Transportation Needs: No Transportation Needs   Lack of Transportation (Medical): No   Lack of Transportation (Non-Medical): No  Physical  Activity: Sufficiently Active   Days of Exercise per Week: 5 days   Minutes of Exercise per Session: 30 min  Stress: No Stress Concern Present   Feeling of Stress : Not at all  Social Connections: Socially Integrated   Frequency of Communication with Friends and Family: More than three times a week   Frequency of Social Gatherings with Friends and Family: More than three times a week   Attends Religious Services: More than 4 times per year   Active Member of [employer] or Organizations: Yes   Attends Music therapist: More than 4 times per year   Marital Status: Married  Human resources officer Violence: Not At Risk   Fear of Current or Ex-Partner: No   Emotionally Abused: No   Physically Abused: No   Sexually Abused: No    Outpatient Medications Prior to Visit  Medication Sig Dispense Refill   atorvastatin (LIPITOR) 20 MG tablet TAKE 1 TABLET(20 MG) BY MOUTH DAILY 90 tablet 3   calcium carbonate (OS-CAL - DOSED IN MG OF ELEMENTAL CALCIUM) 1250 (500 Ca) MG tablet Take 1 tablet by mouth 3 (three) times a week.     LORazepam (ATIVAN) 0.5 MG tablet Take 1 tablet (0.5 mg total) by mouth every 6 (six) hours as needed (Dizziness). 15 tablet 0   losartan (COZAAR) 50 MG tablet Take 1 tablet (50 mg total) by mouth daily. 90 tablet 3   Multiple Vitamins-Minerals (PRESERVISION AREDS PO) Take 1 tablet by mouth in the morning and at bedtime.      Naproxen Sodium (ALEVE PO) Take by mouth.     amLODipine (NORVASC) 5 MG tablet TAKE 1 TABLET(5 MG) BY MOUTH DAILY 90 tablet 0   polyethylene glycol-electrolytes (TRILYTE) 420 g solution Take 4,000 mLs by mouth as directed. 4000 mL 0   No facility-administered medications prior to visit.    No Known Allergies  ROS Review of Systems  Constitutional:  Negative for appetite change, fever and unexpected weight change.  Respiratory:  Negative for cough and shortness of breath.   Cardiovascular:  Negative for chest pain and leg swelling.  Neurological:   Negative for headaches.     Objective:    Physical Exam Constitutional:      Appearance: He is well-developed.  HENT:     Right Ear: External ear normal.     Left Ear: External ear normal.  Eyes:     Pupils: Pupils are equal, round,  and reactive to light.  Neck:     Thyroid: No thyromegaly.  Cardiovascular:     Rate and Rhythm: Normal rate and regular rhythm.  Pulmonary:     Effort: Pulmonary effort is normal. No respiratory distress.     Breath sounds: Normal breath sounds. No wheezing or rales.  Musculoskeletal:     Cervical back: Neck supple.     Right lower leg: No edema.     Left lower leg: No edema.  Neurological:     Mental Status: He is alert and oriented to person, place, and time.    BP (!) 146/80 (BP Location: Right Arm, Cuff Size: Normal)    Pulse 94    Temp 98.2 F (36.8 C) (Oral)    Ht 5\' 10"  (1.778 m)    Wt 196 lb 3.2 oz (89 kg)    SpO2 96%    BMI 28.15 kg/m  Wt Readings from Last 3 Encounters:  04/09/21 196 lb 3.2 oz (89 kg)  03/13/21 189 lb (85.7 kg)  02/05/21 189 lb 12.8 oz (86.1 kg)     There are no preventive care reminders to display for this patient.  There are no preventive care reminders to display for this patient.  Lab Results  Component Value Date   TSH 2.98 03/28/2018   Lab Results  Component Value Date   WBC 5.3 02/02/2021   HGB 14.8 02/02/2021   HCT 43.2 02/02/2021   MCV 91.1 02/02/2021   PLT 122 (L) 02/02/2021   Lab Results  Component Value Date   NA 138 02/02/2021   K 4.4 02/02/2021   CO2 26 02/02/2021   GLUCOSE 157 (H) 02/02/2021   BUN 27 (H) 02/02/2021   CREATININE 1.09 02/02/2021   BILITOT 0.7 05/21/2020   ALKPHOS 93 05/21/2020   AST 19 05/21/2020   ALT 23 05/21/2020   PROT 6.4 05/21/2020   ALBUMIN 4.3 05/21/2020   CALCIUM 8.8 (L) 02/02/2021   ANIONGAP 8 02/02/2021   GFR 68.24 05/21/2020   Lab Results  Component Value Date   CHOL 145 05/21/2020   Lab Results  Component Value Date   HDL 48.50 05/21/2020    Lab Results  Component Value Date   LDLCALC 78 05/21/2020   Lab Results  Component Value Date   TRIG 94.0 05/21/2020   Lab Results  Component Value Date   CHOLHDL 3 05/21/2020   No results found for: HGBA1C    Assessment & Plan:   #1 hypertension.  Recent exacerbation.  Suboptimally controlled.  Increase amlodipine to 10 mg daily.  Continue losartan 50 mg daily.  Continue low-sodium diet.  Watch weight.  Set up 86-month follow-up.  New prescription sent for amlodipine 10 mg daily and watch for edema.  #2 hyperlipidemia.  Patient treated with Lipitor.  Tolerating well with no side effects.  Set up 63-month follow-up and get complete physical labs at that point including lipids and hepatic panel.   Meds ordered this encounter  Medications   amLODipine (NORVASC) 10 MG tablet    Sig: Take 1 tablet (10 mg total) by mouth daily.    Dispense:  90 tablet    Refill:  3    Follow-up: Return in about 3 months (around 07/07/2021).    Carolann Littler, MD

## 2021-06-10 DIAGNOSIS — H35373 Puckering of macula, bilateral: Secondary | ICD-10-CM | POA: Diagnosis not present

## 2021-06-10 DIAGNOSIS — Z9842 Cataract extraction status, left eye: Secondary | ICD-10-CM | POA: Diagnosis not present

## 2021-06-10 DIAGNOSIS — H353211 Exudative age-related macular degeneration, right eye, with active choroidal neovascularization: Secondary | ICD-10-CM | POA: Diagnosis not present

## 2021-06-10 DIAGNOSIS — Z961 Presence of intraocular lens: Secondary | ICD-10-CM | POA: Diagnosis not present

## 2021-06-10 DIAGNOSIS — H43813 Vitreous degeneration, bilateral: Secondary | ICD-10-CM | POA: Diagnosis not present

## 2021-06-10 DIAGNOSIS — H353122 Nonexudative age-related macular degeneration, left eye, intermediate dry stage: Secondary | ICD-10-CM | POA: Diagnosis not present

## 2021-06-10 DIAGNOSIS — H40013 Open angle with borderline findings, low risk, bilateral: Secondary | ICD-10-CM | POA: Diagnosis not present

## 2021-06-10 DIAGNOSIS — Z9841 Cataract extraction status, right eye: Secondary | ICD-10-CM | POA: Diagnosis not present

## 2021-06-27 ENCOUNTER — Other Ambulatory Visit: Payer: Self-pay | Admitting: Family Medicine

## 2021-07-09 ENCOUNTER — Encounter: Payer: Self-pay | Admitting: Family Medicine

## 2021-07-09 ENCOUNTER — Ambulatory Visit: Payer: Medicare PPO | Admitting: Family Medicine

## 2021-07-09 VITALS — BP 122/62 | HR 67 | Temp 98.0°F | Ht 70.0 in | Wt 190.2 lb

## 2021-07-09 DIAGNOSIS — E785 Hyperlipidemia, unspecified: Secondary | ICD-10-CM | POA: Diagnosis not present

## 2021-07-09 DIAGNOSIS — I10 Essential (primary) hypertension: Secondary | ICD-10-CM

## 2021-07-09 LAB — HEPATIC FUNCTION PANEL
ALT: 19 U/L (ref 0–53)
AST: 17 U/L (ref 0–37)
Albumin: 4.3 g/dL (ref 3.5–5.2)
Alkaline Phosphatase: 99 U/L (ref 39–117)
Bilirubin, Direct: 0.1 mg/dL (ref 0.0–0.3)
Total Bilirubin: 0.7 mg/dL (ref 0.2–1.2)
Total Protein: 6.6 g/dL (ref 6.0–8.3)

## 2021-07-09 LAB — LIPID PANEL
Cholesterol: 144 mg/dL (ref 0–200)
HDL: 45.6 mg/dL (ref >39.00)
LDL Cholesterol: 69 mg/dL (ref 0–99)
NonHDL: 98.79
Total CHOL/HDL Ratio: 3
Triglycerides: 151 mg/dL — ABNORMAL HIGH (ref 0.0–149.0)
VLDL: 30.2 mg/dL (ref 0.0–40.0)

## 2021-07-09 LAB — BASIC METABOLIC PANEL
BUN: 28 mg/dL — ABNORMAL HIGH (ref 6–23)
CO2: 29 mEq/L (ref 19–32)
Calcium: 9.3 mg/dL (ref 8.4–10.5)
Chloride: 107 mEq/L (ref 96–112)
Creatinine, Ser: 1.08 mg/dL (ref 0.40–1.50)
GFR: 64.7 mL/min (ref >60.00)
Glucose, Bld: 97 mg/dL (ref 70–99)
Potassium: 4.5 mEq/L (ref 3.5–5.1)
Sodium: 142 mEq/L (ref 135–145)

## 2021-07-09 NOTE — Progress Notes (Signed)
Established Patient Office Visit  Subjective   Patient ID: Jose King, male    DOB: 1940-06-21  Age: 81 y.o. MRN: [id number]  Chief Complaint  Patient presents with   Follow-up    HPI   Seen for medical follow-up.  Last winter he had elevated blood pressure and we had added losartan 50 mg daily.  We also titrated his amlodipine to 10 mg daily.  Blood pressures been very stable since then.  No dizziness.  No lightheadedness.  No chest pains.  Does have home cuff but not monitoring regularly.  No peripheral edema.  He has hyperlipidemia treated with atorvastatin.  No side effects such as myalgias.  Needs follow-up lipids.  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  Followed closely for that.  Vision currently stable.  Denies any recent falls.  No depression issues.  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 repeat 5 years   COLONOSCOPY  2003   RMR: normal   COLONOSCOPY  1999   RMR: adenomatous ICV   COLONOSCOPY N/A 05/09/2014   Procedure: COLONOSCOPY;  Surgeon:  Daneil Dolin, MD;  [facility];  Service: Endoscopy;  Laterality: N/A;  9:15am   COLONOSCOPY N/A 06/09/2019   Procedure: COLONOSCOPY;  Surgeon: Daneil Dolin, MD;  [facility]: [facility];  Service: Endoscopy;  Laterality: N/A;  9:45am   EYE SURGERY  12/2018   cataract    INGUINAL HERNIA REPAIR  1985   POLYPECTOMY  06/09/2019   Procedure: POLYPECTOMY;  Surgeon: Daneil Dolin, MD;  Location: [facility];  Service: Endoscopy;;   [place]    reports that he has never smoked. He has never used smokeless tobacco. He reports that he does not drink alcohol and does not use drugs. family history includes Cancer in some other family members; Cancer (age of onset: 90) in his mother; Cancer (age of onset: 60) in his brother; Heart attack (age of onset: 56) in his father; Lymphoma in his brother; Suicidality in his brother; Vision loss in his paternal uncle. No Known Allergies   Review of Systems  Constitutional:  Negative for chills and fever.  Respiratory:  Negative for cough and shortness of breath.   Cardiovascular:  Negative for chest pain.  Genitourinary:  Negative for dysuria.  Neurological:  Negative for dizziness and headaches.     Objective:  BP 122/62 (BP Location: Left Arm, Patient Position: Sitting, Cuff Size: Normal)   Pulse 67   Temp 98 F (36.7 C) (Oral)   Ht '5\' 10"'$  (1.778 m)   Wt 190 lb 3.2 oz (86.3 kg)   SpO2 99%   BMI 27.29 kg/m  BP Readings from Last 3 Encounters:  07/09/21 122/62  04/09/21 (!) 146/80  03/13/21 (!) 144/77   Wt Readings from Last 3 Encounters:  07/09/21 190 lb 3.2 oz (86.3 kg)  04/09/21 196 lb 3.2 oz (89 kg)  03/13/21 189 lb (85.7 kg)      Physical Exam Vitals reviewed.  Constitutional:      Appearance: Normal appearance.  Cardiovascular:     Rate and Rhythm: Normal rate and regular rhythm.  Pulmonary:     Effort: Pulmonary effort is normal.     Breath sounds: Normal  breath sounds.  Musculoskeletal:     Right lower leg: No edema.     Left lower leg: No edema.  Neurological:     Mental Status: He is alert.     No results found for any visits on 07/09/21.  Last metabolic panel Lab Results  Component Value Date   GLUCOSE 157 (H) 02/02/2021   NA 138 02/02/2021   K 4.4 02/02/2021   CL 104 02/02/2021   CO2 26 02/02/2021   BUN 27 (H) 02/02/2021   CREATININE 1.09 02/02/2021   GFRNONAA >60 02/02/2021   CALCIUM 8.8 (L) 02/02/2021   PROT 6.4 05/21/2020   ALBUMIN 4.3 05/21/2020   BILITOT 0.7 05/21/2020   ALKPHOS 93 05/21/2020   AST 19 05/21/2020   ALT 23 05/21/2020   ANIONGAP 8 02/02/2021   Last lipids Lab Results  Component Value Date   CHOL 145 05/21/2020   HDL 48.50 05/21/2020   LDLCALC 78 05/21/2020   TRIG 94.0 05/21/2020   CHOLHDL 3 05/21/2020      The ASCVD Risk score (Arnett DK, et al., 2019) failed to calculate for the following reasons:   The 2019 ASCVD risk score is only valid for ages 38 to 46    Assessment & Plan:   Problem List Items Addressed This Visit       Unprioritized   Essential hypertension - Primary   Relevant Orders   Basic metabolic panel   Hyperlipidemia   Relevant Orders   Lipid panel   Hepatic function panel  Hypertension is improved with recent addition of losartan and titration of amlodipine.  Tolerating medications well.  Continue current dose as above. -Check basic metabolic panel  -Check lipid and hepatic panel.  Continue Lipitor 20 mg daily.  Return in about 6 months (around 01/09/2022).    Carolann Littler, MD

## 2021-08-26 DIAGNOSIS — H353211 Exudative age-related macular degeneration, right eye, with active choroidal neovascularization: Secondary | ICD-10-CM | POA: Diagnosis not present

## 2021-08-26 DIAGNOSIS — H35373 Puckering of macula, bilateral: Secondary | ICD-10-CM | POA: Diagnosis not present

## 2021-11-18 DIAGNOSIS — H353211 Exudative age-related macular degeneration, right eye, with active choroidal neovascularization: Secondary | ICD-10-CM | POA: Diagnosis not present

## 2022-01-10 ENCOUNTER — Other Ambulatory Visit: Payer: Self-pay | Admitting: Family Medicine

## 2022-01-26 ENCOUNTER — Other Ambulatory Visit: Payer: Self-pay | Admitting: Family Medicine

## 2022-02-06 ENCOUNTER — Encounter: Payer: Self-pay | Admitting: Family Medicine

## 2022-02-06 ENCOUNTER — Ambulatory Visit (INDEPENDENT_AMBULATORY_CARE_PROVIDER_SITE_OTHER): Payer: Medicare PPO | Admitting: Family Medicine

## 2022-02-06 VITALS — BP 128/72 | HR 70 | Temp 98.0°F | Ht 67.75 in | Wt 188.0 lb

## 2022-02-06 DIAGNOSIS — Z0000 Encounter for general adult medical examination without abnormal findings: Secondary | ICD-10-CM

## 2022-02-06 NOTE — Progress Notes (Signed)
Established Patient Office Visit  Subjective   Patient ID: Jose King, male    DOB: 27-May-1940  Age: 81 y.o. MRN: [id number]  Chief Complaint  Patient presents with   Annual Exam    HPI   Jose King is seen for physical exam.  He has hypertension which is controlled with losartan and amlodipine and he takes atorvastatin for hyperlipidemia.  He had labs last May which were stable.  He had prior history of adenomatous colon polyps.  Last colonoscopy age 38.  Aged out of further colonoscopies.  Health maintenance reviewed and up-to-date.  He had recent RSV and COVID booster and has had previous shingles vaccine and also flu vaccine.  Tetanus also up-to-date.  Social history-he is married.  No children.  Retired Education officer, museum and principal.  His wife is also a Pharmacist, hospital.  He never smoked.  No alcohol.  Family history-'s father had MI at age 13 and died of that.  His mother died age 41 of colon cancer.  He has a sister who is alive.  Couple brothers that died 54 of suicide and the other died of pancreatic cancer.  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 repeat 5 years   COLONOSCOPY  2003   RMR: normal   COLONOSCOPY  1999   RMR: adenomatous ICV   COLONOSCOPY N/A 05/09/2014   Procedure: COLONOSCOPY;  Surgeon: Daneil Dolin, MD;  [facility];  Service: Endoscopy;  Laterality: N/A;  9:15am   COLONOSCOPY N/A 06/09/2019   Procedure: COLONOSCOPY;  Surgeon: Daneil Dolin, MD;  [facility];  Service: Endoscopy;  Laterality: N/A;  9:45am   EYE SURGERY  12/2018   cataract    INGUINAL HERNIA REPAIR  1985   POLYPECTOMY  06/09/2019   Procedure: POLYPECTOMY;  Surgeon: Daneil Dolin, MD;  [facility];  Service: Endoscopy;;   Jose King    reports that he has never smoked. He has never used smokeless tobacco. He reports that he does not drink alcohol and does not use drugs. family history includes Cancer in some other family members; Cancer (age of onset: 68) in his mother; Cancer (age of onset: 60) in his brother; Drug abuse in his maternal aunt; Heart attack (age of onset: 39) in his father; Lymphoma in his brother; Suicidality in his brother; Vision loss in his paternal uncle. No Known Allergies  Review of Systems  Constitutional:  Negative  for chills, fever, malaise/fatigue and weight loss.  HENT:  Negative for hearing loss.   Eyes:  Negative for blurred vision and double vision.  Respiratory:  Negative for cough and shortness of breath.   Cardiovascular:  Negative for chest pain, palpitations and leg swelling.  Gastrointestinal:  Negative for abdominal pain, blood in stool, constipation and diarrhea.  Genitourinary:  Negative for dysuria.  Skin:  Negative for rash.  Neurological:  Negative for dizziness, speech change, seizures, loss of consciousness and headaches.  Psychiatric/Behavioral:  Negative for depression.       Objective:     BP 128/72   Pulse 70   Temp 98 F (36.7 C) (Oral)   Ht 5' 7.75" (1.721 m)   Wt 188 lb (85.3 kg)   SpO2  98%   BMI 28.80 kg/m    Physical Exam Vitals reviewed.  Constitutional:      General: He is not in acute distress.    Appearance: He is well-developed.  HENT:     Head: Normocephalic and atraumatic.     Right Ear: External ear normal.     Left Ear: External ear normal.  Eyes:     Conjunctiva/sclera: Conjunctivae normal.     Pupils: Pupils are equal, round, and reactive to light.  Neck:     Thyroid: No thyromegaly.  Cardiovascular:     Rate and Rhythm: Normal rate and regular rhythm.     Heart sounds: Normal heart sounds. No murmur heard. Pulmonary:     Effort: No respiratory distress.     Breath sounds: No wheezing or rales.  Abdominal:     General: Bowel sounds are normal. There is no distension.     Palpations: Abdomen is soft. There is no mass.     Tenderness: There is no abdominal tenderness. There is no guarding or rebound.  Musculoskeletal:     Cervical back: Normal range of motion and neck supple.  Lymphadenopathy:     Cervical: No cervical adenopathy.  Skin:    Findings: No rash.  Neurological:     Mental Status: He is alert and oriented to person, place, and time.     Cranial Nerves: No cranial nerve deficit.      No results found for any visits on 02/06/22.    The ASCVD Risk score (Arnett DK, et al., 2019) failed to calculate for the following reasons:   The 2019 ASCVD risk score is only valid for ages 52 to 10    Assessment & Plan:   Problem List Items Addressed This Visit   None Visit Diagnoses     Physical exam    -  Primary     He has hypertension which is well-controlled.  He has hyperlipidemia treated with atorvastatin.  Health maintenance up-to-date as above.  Continue annual flu vaccine.  He had labs in May so we will defer until about 6 months from now for follow-up labs.  -Discussed fall prevention. -Continue to stay very active.  Return in about 6 months (around 08/08/2022).    Carolann Littler, MD

## 2022-02-10 DIAGNOSIS — H353122 Nonexudative age-related macular degeneration, left eye, intermediate dry stage: Secondary | ICD-10-CM | POA: Diagnosis not present

## 2022-02-10 DIAGNOSIS — H353211 Exudative age-related macular degeneration, right eye, with active choroidal neovascularization: Secondary | ICD-10-CM | POA: Diagnosis not present

## 2022-02-10 DIAGNOSIS — H35373 Puckering of macula, bilateral: Secondary | ICD-10-CM | POA: Diagnosis not present

## 2022-02-10 DIAGNOSIS — H43813 Vitreous degeneration, bilateral: Secondary | ICD-10-CM | POA: Diagnosis not present

## 2022-03-02 DIAGNOSIS — Z1283 Encounter for screening for malignant neoplasm of skin: Secondary | ICD-10-CM | POA: Diagnosis not present

## 2022-03-02 DIAGNOSIS — D225 Melanocytic nevi of trunk: Secondary | ICD-10-CM | POA: Diagnosis not present

## 2022-03-02 DIAGNOSIS — L570 Actinic keratosis: Secondary | ICD-10-CM | POA: Diagnosis not present

## 2022-03-02 DIAGNOSIS — L820 Inflamed seborrheic keratosis: Secondary | ICD-10-CM | POA: Diagnosis not present

## 2022-03-02 DIAGNOSIS — X32XXXA Exposure to sunlight, initial encounter: Secondary | ICD-10-CM | POA: Diagnosis not present

## 2022-03-03 ENCOUNTER — Ambulatory Visit: Payer: Medicare PPO | Admitting: Dermatology

## 2022-03-16 ENCOUNTER — Ambulatory Visit (INDEPENDENT_AMBULATORY_CARE_PROVIDER_SITE_OTHER): Payer: Medicare PPO

## 2022-03-16 VITALS — Ht 70.0 in | Wt 182.0 lb

## 2022-03-16 DIAGNOSIS — Z0000 Encounter for general adult medical examination without abnormal findings: Secondary | ICD-10-CM | POA: Diagnosis not present

## 2022-03-16 NOTE — Patient Instructions (Addendum)
Jose King , Thank you for taking time to come for your Medicare Wellness Visit. I appreciate your ongoing commitment to your health goals. Please review the following plan we discussed and let me know if I can assist you in the future.   These are the goals we discussed:  Goals       DIET - REDUCE SUGAR INTAKE      Cut back on sweets; jellies; cake and pies.        Patient Stated      01/16/2019 need for developing a routine exercise plan and decreasing intake of sweets.       Patient Stated      I want to maintain my independence and travel more!       Reduce sugar intake to X grams per day      Cut back on sweets and eat more        Stay Healthy (pt-stated)        This is a list of the screening recommended for you and due dates:  Health Maintenance  Topic Date Due   COVID-19 Vaccine (5 - 2023-24 season) 04/01/2022*   Medicare Annual Wellness Visit  03/17/2023   DTaP/Tdap/Td vaccine (3 - Td or Tdap) 06/18/2025   Pneumonia Vaccine  Completed   Flu Shot  Completed   Zoster (Shingles) Vaccine  Completed   HPV Vaccine  Aged Out  *Topic was postponed. The date shown is not the original due date.    Advanced directives: Please bring a copy of your health care power of attorney and living will to the office to be added to your chart at your convenience.   Conditions/risks identified: None  Next appointment: Follow up in one year for your annual wellness visit.    Preventive Care 88 Years and Older, Male  Preventive care refers to lifestyle choices and visits with your health care provider that can promote health and wellness. What does preventive care include? A yearly physical exam. This is also called an annual well check. Dental exams once or twice a year. Routine eye exams. Ask your health care provider how often you should have your eyes checked. Personal lifestyle choices, including: Daily care of your teeth and gums. Regular physical activity. Eating a healthy  diet. Avoiding tobacco and drug use. Limiting alcohol use. Practicing safe sex. Taking low doses of aspirin every day. Taking vitamin and mineral supplements as recommended by your health care provider. What happens during an annual well check? The services and screenings done by your health care provider during your annual well check will depend on your age, overall health, lifestyle risk factors, and family history of disease. Counseling  Your health care provider may ask you questions about your: Alcohol use. Tobacco use. Drug use. Emotional well-being. Home and relationship well-being. Sexual activity. Eating habits. History of falls. Memory and ability to understand (cognition). Work and work Statistician. Screening  You may have the following tests or measurements: Height, weight, and BMI. Blood pressure. Lipid and cholesterol levels. These may be checked every 5 years, or more frequently if you are over 90 years old. Skin check. Lung cancer screening. You may have this screening every year starting at age 19 if you have a 30-pack-year history of smoking and currently smoke or have quit within the past 15 years. Fecal occult blood test (FOBT) of the stool. You may have this test every year starting at age 43. Flexible sigmoidoscopy or colonoscopy. You may have a  sigmoidoscopy every 5 years or a colonoscopy every 10 years starting at age 45. Prostate cancer screening. Recommendations will vary depending on your family history and other risks. Hepatitis C blood test. Hepatitis B blood test. Sexually transmitted disease (STD) testing. Diabetes screening. This is done by checking your blood sugar (glucose) after you have not eaten for a while (fasting). You may have this done every 1-3 years. Abdominal aortic aneurysm (AAA) screening. You may need this if you are a current or former smoker. Osteoporosis. You may be screened starting at age 56 if you are at high risk. Talk with  your health care provider about your test results, treatment options, and if necessary, the need for more tests. Vaccines  Your health care provider may recommend certain vaccines, such as: Influenza vaccine. This is recommended every year. Tetanus, diphtheria, and acellular pertussis (Tdap, Td) vaccine. You may need a Td booster every 10 years. Zoster vaccine. You may need this after age 13. Pneumococcal 13-valent conjugate (PCV13) vaccine. One dose is recommended after age 38. Pneumococcal polysaccharide (PPSV23) vaccine. One dose is recommended after age 72. Talk to your health care provider about which screenings and vaccines you need and how often you need them. This information is not intended to replace advice given to you by your health care provider. Make sure you discuss any questions you have with your health care provider. Document Released: 03/01/2015 Document Revised: 10/23/2015 Document Reviewed: 12/04/2014 Elsevier Interactive Patient Education  2017 [place] Prevention in the Home Falls can cause injuries. They can happen to people of all ages. There are many things you can do to make your home safe and to help prevent falls. What can I do on the outside of my home? Regularly fix the edges of walkways and driveways and fix any cracks. Remove anything that might make you trip as you walk through a door, such as a raised step or threshold. Trim any bushes or trees on the path to your home. Use bright outdoor lighting. Clear any walking paths of anything that might make someone trip, such as rocks or tools. Regularly check to see if handrails are loose or broken. Make sure that both sides of any steps have handrails. Any raised decks and porches should have guardrails on the edges. Have any leaves, snow, or ice cleared regularly. Use sand or salt on walking paths during winter. Clean up any spills in your garage right away. This includes oil or grease spills. What  can I do in the bathroom? Use night lights. Install grab bars by the toilet and in the tub and shower. Do not use towel bars as grab bars. Use non-skid mats or decals in the tub or shower. If you need to sit down in the shower, use a plastic, non-slip stool. Keep the floor dry. Clean up any water that spills on the floor as soon as it happens. Remove soap buildup in the tub or shower regularly. Attach bath mats securely with double-sided non-slip rug tape. Do not have throw rugs and other things on the floor that can make you trip. What can I do in the bedroom? Use night lights. Make sure that you have a light by your bed that is easy to reach. Do not use any sheets or blankets that are too big for your bed. They should not hang down onto the floor. Have a firm chair that has side arms. You can use this for support while you get dressed. Do not  have throw rugs and other things on the floor that can make you trip. What can I do in the kitchen? Clean up any spills right away. Avoid walking on wet floors. Keep items that you use a lot in easy-to-reach places. If you need to reach something above you, use a strong step stool that has a grab bar. Keep electrical cords out of the way. Do not use floor polish or wax that makes floors slippery. If you must use wax, use non-skid floor wax. Do not have throw rugs and other things on the floor that can make you trip. What can I do with my stairs? Do not leave any items on the stairs. Make sure that there are handrails on both sides of the stairs and use them. Fix handrails that are broken or loose. Make sure that handrails are as long as the stairways. Check any carpeting to make sure that it is firmly attached to the stairs. Fix any carpet that is loose or worn. Avoid having throw rugs at the top or bottom of the stairs. If you do have throw rugs, attach them to the floor with carpet tape. Make sure that you have a light switch at the top of the  stairs and the bottom of the stairs. If you do not have them, ask someone to add them for you. What else can I do to help prevent falls? Wear shoes that: Do not have high heels. Have rubber bottoms. Are comfortable and fit you well. Are closed at the toe. Do not wear sandals. If you use a stepladder: Make sure that it is fully opened. Do not climb a closed stepladder. Make sure that both sides of the stepladder are locked into place. Ask someone to hold it for you, if possible. Clearly mark and make sure that you can see: Any grab bars or handrails. First and last steps. Where the edge of each step is. Use tools that help you move around (mobility aids) if they are needed. These include: Canes. Walkers. Scooters. Crutches. Turn on the lights when you go into a dark area. Replace any light bulbs as soon as they burn out. Set up your furniture so you have a clear path. Avoid moving your furniture around. If any of your floors are uneven, fix them. If there are any pets around you, be aware of where they are. Review your medicines with your doctor. Some medicines can make you feel dizzy. This can increase your chance of falling. Ask your doctor what other things that you can do to help prevent falls. This information is not intended to replace advice given to you by your health care provider. Make sure you discuss any questions you have with your health care provider. Document Released: 11/29/2008 Document Revised: 07/11/2015 Document Reviewed: 03/09/2014 Elsevier Interactive Patient Education  2017 [employer].

## 2022-03-16 NOTE — Progress Notes (Signed)
Subjective:   Jose King is a 82 y.o. male who presents for Medicare Annual/Subsequent preventive examination.  Review of Systems    Virtual Visit via Telephone Note  I connected with  Jose King on 03/16/22 at  9:45 AM EST by telephone and verified that I am speaking with the correct person using two identifiers.  Location: Patient: Home Provider: Office Persons participating in the virtual visit: patient/Nurse Health Advisor   I discussed the limitations, risks, security and privacy concerns of performing an evaluation and management service by telephone and the availability of in person appointments. The patient expressed understanding and agreed to proceed.  Interactive audio and video telecommunications were attempted between this nurse and patient, however failed, due to patient having technical difficulties OR patient did not have access to video capability.  We continued and completed visit with audio only.  Some vital signs may be absent or patient reported.   Criselda Peaches, LPN  Cardiac Risk Factors include: advanced age (>68mn, >>78women);male gender;hypertension     Objective:    Today's Vitals   03/16/22 0946  Weight: 182 lb (82.6 kg)  Height: '5\' 10"'$  (1.778 m)   Body mass index is 26.11 kg/m.     03/16/2022    9:53 AM 03/13/2021    9:07 AM 02/02/2021    6:45 PM 04/10/2020    9:17 AM 06/09/2019    8:55 AM 01/16/2019   10:07 AM 03/08/2017    8:18 AM  Advanced Directives  Does Patient Have a Medical Advance Directive? Yes Yes No Yes Yes Yes No  Type of [place]amed[place]of [place] will Living will  [place] will [place] will Living will   Does patient want to make changes to medical advance directive?  No - Patient declined  No - Patient declined     Copy of HChildressin Chart? No - copy requested   No - copy requested No - copy requested     Would patient like information on creating a medical advance directive?   No - Patient declined        Current Medications (verified) Outpatient Encounter Medications as of 03/16/2022  Medication Sig   amLODipine (NORVASC) 10 MG tablet Take 1 tablet (10 mg total) by mouth daily.   atorvastatin (LIPITOR) 20 MG tablet TAKE 1 TABLET(20 MG) BY MOUTH DAILY   calcium carbonate (OS-CAL - DOSED IN MG OF ELEMENTAL CALCIUM) 1250 (500 Ca) MG tablet Take 1 tablet by mouth 3 (three) times a week.   LORazepam (ATIVAN) 0.5 MG tablet Take 1 tablet (0.5 mg total) by mouth every 6 (six) hours as needed (Dizziness).   losartan (COZAAR) 50 MG tablet TAKE 1 TABLET(50 MG) BY MOUTH DAILY   Multiple Vitamins-Minerals (PRESERVISION AREDS PO) Take 1 tablet by mouth in the morning and at bedtime.    Naproxen Sodium (ALEVE PO) Take by mouth.   No facility-administered encounter medications on file as of 03/16/2022.    Allergies (verified) Patient has no known allergies.   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 repeat 5 years   COLONOSCOPY  2003   RMR: normal    COLONOSCOPY  1999   RMR: adenomatous ICV   COLONOSCOPY N/A 05/09/2014   Procedure: COLONOSCOPY;  Surgeon: Daneil Dolin, MD;  [facility];  Service: Endoscopy;  Laterality: N/A;  9:15am   COLONOSCOPY N/A 06/09/2019   Procedure: COLONOSCOPY;  Surgeon: Daneil Dolin, MD;  [facility];  Service: Endoscopy;  Laterality: N/A;  9:45am   EYE SURGERY  12/2018   cataract    INGUINAL HERNIA REPAIR  1985   POLYPECTOMY  06/09/2019   Procedure: POLYPECTOMY;  Surgeon: Daneil Dolin, MD;  [facility];  Service: Endoscopy;;   PROSTATE SURGERY     TONSILLECTOMY AND ADENOIDECTOMY  1948   Family History  Problem Relation Age of Onset   Cancer Mother 27       colon   Heart attack Father 64   Suicidality Brother    Cancer Brother 6       Pancreatic   Lymphoma Brother    Drug abuse Maternal Aunt    Vision loss Paternal Uncle        Massive Stroke   Cancer Other        grandfather, mouth   Cancer Other        grandmother, intestional    Social History   Socioeconomic History   Marital status: Married    Spouse name: Not on file   Number of children: 0   Years of education: Not on file   Highest education level: Master's degree (e.g., MA, MS, MEng, MEd, MSW, MBA)  Occupational History   Occupation: Retired  Tobacco Use   Smoking status: Never   Smokeless tobacco: Never  Vaping Use   Vaping Use: Never used  Substance and Sexual Activity   Alcohol use: No   Drug use: No   Sexual activity: Not on file  Other Topics Concern   Not on file  Social History Narrative   Married   Gets regular exercise from farm work   Social Determinants of Health   Financial Resource Strain: [place]  (03/16/2022)   Overall Financial Resource Strain (CARDIA)    Difficulty of Paying Living Expenses: Not hard at all  Food Insecurity: No Food Insecurity (03/16/2022)   Hunger Vital Sign    Worried About Running Out of Food in the Last Year: Never true    [place] in the Last Year: Never true  Transportation Needs: No Transportation Needs (03/16/2022)   PRAPARE - Hydrologist (Medical): No    Lack of Transportation (Non-Medical): No  Physical Activity: Inactive (03/16/2022)   Exercise Vital Sign    Days of Exercise per Week: 0 days    Minutes of Exercise per Session: 0 min  Stress: No Stress Concern Present (03/16/2022)  [employer]    Feeling of Stress : Not at all  Social Connections: Socially Integrated (03/16/2022)   Social Connection and Isolation Panel [NHANES]    Frequency of Communication with Friends and Family: More than three times a week    Frequency of Social Gatherings with Friends and Family: More than three times a week    Attends Religious Services: More than 4 times per year    Active Member of [employer] or Organizations: Yes    Attends Music therapist: More than 4 times per year    Marital Status: Married    Tobacco Counseling Counseling given: Not Answered   Clinical Intake:  Pre-visit preparation completed: Yes  Pain : No/denies pain     BMI - recorded: 26.11 Nutritional Status: BMI 25 -29 Overweight Diabetes: No  How often do you need to have someone help you when you read instructions, pamphlets, or other written materials from your doctor or pharmacy?: 1 - Never  Diabetic?  No  Interpreter Needed?: No  Information entered by :: Rolene Arbour LPN   Activities of Daily Living    03/16/2022    9:52 AM 03/13/2022    3:41 PM  In your present state of health, do you have any difficulty performing the following activities:  Hearing? 0 1  Vision? 0 1  Difficulty concentrating or making decisions? 0 0  Walking or climbing stairs? 0 0  Dressing or bathing? 0 0  Doing errands, shopping? 0 0  Preparing Food and eating ? N N  Using the Toilet? N N  In the past six months, have you accidently leaked  urine? N N  Do you have problems with loss of bowel control? N N  Managing your Medications? N N  Managing your Finances? N N  Housekeeping or managing your Housekeeping? N N    Patient Care Team: Eulas Post, MD as PCP - General (Family Medicine) Gala Romney Cristopher Estimable, MD as Consulting Physician (Gastroenterology) Lavonna Monarch, MD (Inactive) as Consulting Physician (Dermatology)  Indicate any recent Medical Services you may have received from other than [facility] providers in the past year (date may be approximate).     Assessment:   This is a routine wellness examination for Burch.  Hearing/Vision screen Hearing Screening - Comments:: Denies hearing difficulties   Vision Screening - Comments:: Wears reading glasses - up to date with routine eye exams with  Dr Brigitte Pulse  Dietary issues and exercise activities discussed: Current Exercise Habits: The patient does not participate in regular exercise at present, Exercise limited by: None identified   Goals Addressed               This Visit's Progress     Stay Healthy (pt-stated)         Depression Screen    03/16/2022    9:51 AM 02/06/2022    8:26 AM 03/13/2021    9:00 AM 02/05/2021    2:28 PM 04/10/2020    9:20 AM 01/16/2019   10:10 AM 12/22/2017    8:35 AM  PHQ 2/9 Scores  PHQ - 2 Score 0 0 0 0 0 0 0  PHQ- 9 Score 0 0         Fall Risk    03/16/2022    9:52 AM 03/13/2022    3:41 PM 02/06/2022    8:27 AM 03/13/2021    9:04 AM 02/05/2021    2:28 PM  Fall Risk  Falls in the past year? 0 0 0 0 0  Number falls in past yr: 0 0 0 0   Injury with Fall? 0  0 0   Risk for fall due to : No Fall Risks  No Fall Risks    Follow up Falls prevention discussed  Falls evaluation completed      [place]:  Any stairs in or around the home? Yes  If so, are there any without handrails? No  Home free of loose throw rugs in walkways, pet beds, electrical cords, etc? Yes  Adequate lighting in  your home to reduce risk of falls? Yes   ASSISTIVE DEVICES UTILIZED TO PREVENT FALLS:  Life alert? No  Use of a cane, walker or w/c? No  Grab bars in the bathroom? Yes  Shower chair or bench in shower? Yes  Elevated toilet seat or a handicapped toilet? No   TIMED UP AND GO:  Was the test performed? No . Audio Visit   Cognitive Function:        03/16/2022    9:53 AM 03/13/2021    9:05 AM 06/19/2016    8:36 AM  6CIT Screen  What Year? 0 points 0 points 0 points  What month? 0 points 0 points 0 points  What time? 0 points 0 points 0 points  Count back from 20 0 points 0 points 0 points  Months in reverse 0 points 0 points 0 points  Repeat phrase 0 points 0 points 0 points  Total Score 0 points 0 points 0 points    Immunizations Immunization History  Administered Date(s) Administered   Fluad Quad(high Dose 65+) 11/18/2020, 11/07/2021   Influenza, High Dose Seasonal PF 11/18/2017, 11/02/2018   Influenza-Unspecified 11/14/2016, 12/01/2019   PFIZER(Purple Top)SARS-COV-2 Vaccination 03/03/2019, 03/24/2019, 10/19/2019   Pfizer Covid-19 Vaccine Bivalent Booster 21yr & up 11/18/2020   Pneumococcal Conjugate-13 12/13/2013   Tdap 11/14/2013, 06/19/2015   Zoster Recombinat (Shingrix) 06/26/2016, 11/15/2016    TDAP status: Up to date  Flu Vaccine status: Up to date  Pneumococcal vaccine status: Up to date  Covid-19 vaccine status: Completed vaccines  Qualifies for Shingles Vaccine? Yes   Zostavax completed Yes   Shingrix Completed?: Yes  Screening Tests Health Maintenance  Topic Date Due   COVID-19 Vaccine (5 - 2023-24 season) 04/01/2022 (Originally 10/17/2021)   Medicare Annual Wellness (AWV)  03/17/2023   DTaP/Tdap/Td (3 - Td or Tdap) 06/18/2025   Pneumonia Vaccine 82 Years old  Completed   INFLUENZA VACCINE  Completed   Zoster Vaccines- Shingrix  Completed   HPV VACCINES  Aged Out    Health Maintenance  There are no preventive care reminders to display for this  patient.   Colorectal cancer screening: No longer required.   Lung Cancer Screening: (Low Dose CT Chest recommended if Age 82-80years, 30 pack-year currently smoking OR have quit w/in 15years.) does not qualify.    Additional Screening:  Hepatitis C Screening: does not qualify; Completed   Vision Screening: Recommended annual ophthalmology exams for early detection of glaucoma and other disorders of the eye. Is the patient up to date with their annual eye exam?  Yes  Who is the provider or what is the name of the office in which the patient attends annual eye exams? Sr SBrigitte PulseIf pt is not established with a provider, would they like to be referred to a provider to establish care? No .   Dental Screening: Recommended annual dental exams  for proper oral hygiene  Community Resource Referral / Chronic Care Management:  CRR required this visit?  No   CCM required this visit?  No      Plan:     I have personally reviewed and noted the following in the patient's chart:   Medical and social history Use of alcohol, tobacco or illicit drugs  Current medications and supplements including opioid prescriptions. Patient is not currently taking opioid prescriptions. Functional ability and status Nutritional status Physical activity Advanced directives List of other physicians Hospitalizations, surgeries, and ER visits in previous 12 months Vitals Screenings to include cognitive, depression, and falls Referrals and appointments  In addition, I have reviewed and discussed with patient certain preventive protocols, quality metrics, and best practice recommendations. A written personalized care plan for preventive services as well as general preventive health recommendations were provided to patient.     Criselda Peaches, LPN   [id number]/[id number]   Nurse Notes: None

## 2022-04-02 ENCOUNTER — Other Ambulatory Visit: Payer: Self-pay | Admitting: Family Medicine

## 2022-04-16 ENCOUNTER — Encounter: Payer: Self-pay | Admitting: Radiology

## 2022-05-04 ENCOUNTER — Other Ambulatory Visit: Payer: Self-pay | Admitting: Family Medicine

## 2022-05-19 DIAGNOSIS — H40013 Open angle with borderline findings, low risk, bilateral: Secondary | ICD-10-CM | POA: Diagnosis not present

## 2022-05-19 DIAGNOSIS — Z961 Presence of intraocular lens: Secondary | ICD-10-CM | POA: Diagnosis not present

## 2022-05-19 DIAGNOSIS — H353211 Exudative age-related macular degeneration, right eye, with active choroidal neovascularization: Secondary | ICD-10-CM | POA: Diagnosis not present

## 2022-05-19 DIAGNOSIS — H43813 Vitreous degeneration, bilateral: Secondary | ICD-10-CM | POA: Diagnosis not present

## 2022-05-19 DIAGNOSIS — Z9841 Cataract extraction status, right eye: Secondary | ICD-10-CM | POA: Diagnosis not present

## 2022-05-19 DIAGNOSIS — H35373 Puckering of macula, bilateral: Secondary | ICD-10-CM | POA: Diagnosis not present

## 2022-05-19 DIAGNOSIS — H353132 Nonexudative age-related macular degeneration, bilateral, intermediate dry stage: Secondary | ICD-10-CM | POA: Diagnosis not present

## 2022-05-19 DIAGNOSIS — Z9842 Cataract extraction status, left eye: Secondary | ICD-10-CM | POA: Diagnosis not present

## 2022-07-09 ENCOUNTER — Telehealth: Payer: Self-pay | Admitting: Family Medicine

## 2022-07-09 MED ORDER — LOSARTAN POTASSIUM 50 MG PO TABS: ORAL_TABLET | ORAL | 0 refills | Status: DC

## 2022-07-09 MED ORDER — AMLODIPINE BESYLATE 10 MG PO TABS: ORAL_TABLET | ORAL | 0 refills | Status: DC

## 2022-07-09 MED ORDER — ATORVASTATIN CALCIUM 20 MG PO TABS: ORAL_TABLET | ORAL | 0 refills | Status: DC

## 2022-07-09 NOTE — Telephone Encounter (Signed)
Rx done. 

## 2022-07-09 NOTE — Telephone Encounter (Signed)
Prescription Request  07/09/2022  LOV: 02/06/2022  What is the name of the medication or equipment? amLODipine (NORVASC) 10 MG tablet , atorvastatin (LIPITOR) 20 MG tablet , losartan (COZAAR) 50 MG tablet  [facility] [phone] - [place], [place] - [address] AT [facility] [address] [place] [address] Phone: [phone] Fax: [phone]    Patient notified that their request is being sent to the clinical staff for review and that they should receive a response within 2 business days.   Please advise at Mobile [phone] (mobile)

## 2022-08-25 DIAGNOSIS — H353211 Exudative age-related macular degeneration, right eye, with active choroidal neovascularization: Secondary | ICD-10-CM | POA: Diagnosis not present

## 2022-08-25 DIAGNOSIS — H35373 Puckering of macula, bilateral: Secondary | ICD-10-CM | POA: Diagnosis not present

## 2022-09-16 ENCOUNTER — Encounter: Payer: Self-pay | Admitting: Family Medicine

## 2022-09-16 ENCOUNTER — Telehealth: Payer: Medicare PPO | Admitting: Family Medicine

## 2022-09-16 VITALS — BP 118/69 | Temp 97.6°F | Ht 70.0 in | Wt 182.0 lb

## 2022-09-16 DIAGNOSIS — U071 COVID-19: Secondary | ICD-10-CM

## 2022-09-16 MED ORDER — MOLNUPIRAVIR EUA 200MG CAPSULE: 4.0000 | ORAL_CAPSULE | Freq: Two times a day (BID) | ORAL | 0 refills | Status: AC

## 2022-09-16 NOTE — Progress Notes (Signed)
Patient ID: Jose King, male   DOB: 1940/06/22, 82 y.o.   MRN: [id number]   Virtual Visit via Video Note  I connected with Thalia Party on 09/16/22 at 10:00 AM EDT by a video enabled telemedicine application and verified that I am speaking with the correct person using two identifiers.  Location patient: home Location provider:work or home office Persons participating in the virtual visit: patient, provider  I discussed the limitations of evaluation and management by telemedicine and the availability of in person appointments. The patient expressed understanding and agreed to proceed.   HPI:  Jose King has COVID-19 by home test.  His wife actually tested positive on Monday and she is doing better at this time taking antiviral therapy.  He woke up around 3 AM with sore throat, mild cough, mild headache.  No significant nasal congestion yet.  Denies any nausea, vomiting, or diarrhea.  No dyspnea.  His chronic problems include history of hypertension and hyperlipidemia.   ROS: See pertinent positives and negatives per HPI.  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 repeat 5 years   COLONOSCOPY  2003   RMR: normal   COLONOSCOPY  1999   RMR: adenomatous ICV   COLONOSCOPY N/A 05/09/2014   Procedure: COLONOSCOPY;  Surgeon: Corbin Ade, MD;  [facility];  Service: Endoscopy;  Laterality: N/A;  9:15am   COLONOSCOPY N/A 06/09/2019   Procedure: COLONOSCOPY;  Surgeon: Corbin Ade, MD;  [facility];  Service: Endoscopy;  Laterality: N/A;  9:45am   EYE SURGERY  12/2018   cataract    INGUINAL HERNIA REPAIR  1985   POLYPECTOMY  06/09/2019   Procedure: POLYPECTOMY;  Surgeon: Corbin Ade, MD;  [facility];  Service: Endoscopy;;   PROSTATE SURGERY     TONSILLECTOMY AND ADENOIDECTOMY  1948    Family History  Problem Relation Age of Onset   Cancer Mother 29       colon   Heart attack Father 61   Suicidality Brother    Cancer Brother 24       Pancreatic   Lymphoma Brother    Drug abuse Maternal Aunt    Vision loss Paternal Uncle        Massive Stroke   Cancer Other        grandfather, mouth   Cancer Other        grandmother, intestional     SOCIAL HX: Non-smoker   Current Outpatient Medications:  amLODipine (NORVASC) 10 MG tablet, TAKE 1 TABLET(10 MG) BY MOUTH DAILY, Disp: 90 tablet, Rfl: 0   atorvastatin (LIPITOR) 20 MG tablet, TAKE 1 TABLET(20 MG) BY MOUTH DAILY, Disp: 90 tablet, Rfl: 0   calcium carbonate (OS-CAL - DOSED IN MG OF ELEMENTAL CALCIUM) 1250 (500 Ca) MG tablet, Take 1 tablet by mouth 3 (three) times a week., Disp: , Rfl:    LORazepam (ATIVAN) 0.5 MG tablet, Take 1 tablet (0.5 mg total) by mouth every 6 (six) hours as needed (Dizziness)., Disp: 15 tablet, Rfl: 0   losartan (COZAAR) 50 MG tablet, TAKE 1 TABLET(50 MG) BY MOUTH DAILY, Disp: 90 tablet, Rfl: 0   molnupiravir EUA (LAGEVRIO) 200 mg CAPS capsule, Take 4 capsules (800 mg total) by mouth 2 (two) times daily for 5 days., Disp: 40 capsule, Rfl: 0   Multiple Vitamins-Minerals (PRESERVISION AREDS PO), Take 1  tablet by mouth in the morning and at bedtime. , Disp: , Rfl:    Naproxen Sodium (ALEVE PO), Take by mouth., Disp: , Rfl:   EXAM:  VITALS per patient if applicable:  GENERAL: alert, oriented, appears well and in no acute distress  HEENT: atraumatic, conjunttiva clear, no obvious abnormalities on inspection of external nose and ears  NECK: normal movements of the head and neck  LUNGS: on inspection no signs of respiratory distress, breathing rate appears normal, no obvious gross SOB, gasping or wheezing  CV: no obvious cyanosis  MS: moves all visible extremities without noticeable abnormality  PSYCH/NEURO: pleasant and cooperative, no obvious depression or anxiety, speech and thought processing grossly intact  ASSESSMENT AND PLAN:  Discussed the following assessment and plan:  COVID-19 infection with onset of symptoms less than 24 hours ago.  Patient nontoxic in appearance.  We discussed antiviral therapy.  He takes several medications that could interact with Paxlovid.  We discussed starting molnupiravir 4 capsules by mouth twice daily for 5 days. -Plenty of fluids and rest -Reviewed isolation recommendations -Follow-up for any persistent or worsening symptoms     I discussed the assessment and treatment plan with the patient. The patient was provided an opportunity to ask questions and all were answered. The patient agreed with the plan and demonstrated an understanding of the instructions.   The patient was advised to call back or seek an in-person evaluation if the symptoms worsen or if the condition fails to improve as anticipated.     Evelena Peat, MD

## 2022-10-07 DIAGNOSIS — L570 Actinic keratosis: Secondary | ICD-10-CM | POA: Diagnosis not present

## 2022-10-07 DIAGNOSIS — D485 Neoplasm of uncertain behavior of skin: Secondary | ICD-10-CM | POA: Diagnosis not present

## 2022-10-07 DIAGNOSIS — Z1283 Encounter for screening for malignant neoplasm of skin: Secondary | ICD-10-CM | POA: Diagnosis not present

## 2022-10-07 DIAGNOSIS — D225 Melanocytic nevi of trunk: Secondary | ICD-10-CM | POA: Diagnosis not present

## 2022-10-07 DIAGNOSIS — D2272 Melanocytic nevi of left lower limb, including hip: Secondary | ICD-10-CM | POA: Diagnosis not present

## 2022-10-07 DIAGNOSIS — X32XXXD Exposure to sunlight, subsequent encounter: Secondary | ICD-10-CM | POA: Diagnosis not present

## 2022-10-08 ENCOUNTER — Other Ambulatory Visit: Payer: Self-pay | Admitting: Family Medicine

## 2022-11-02 ENCOUNTER — Other Ambulatory Visit: Payer: Self-pay | Admitting: Family Medicine

## 2022-11-02 ENCOUNTER — Telehealth: Payer: Self-pay | Admitting: Family Medicine

## 2022-11-02 NOTE — Telephone Encounter (Signed)
Prescription Request  11/02/2022  LOV: 02/06/2022  What is the name of the medication or equipment? Atorvastatin, Losartan.  Have you contacted your pharmacy to request a refill? Yes   Which pharmacy would you like this sent to? [facility] [phone] - [place], [place] - [address] AT [facility] [address] [place] [address] Phone: [phone] Fax: [phone]   Patient notified that their request is being sent to the clinical staff for review and that they should receive a response within 2 business days.   Please advise at Mobile [phone] (mobile)

## 2022-11-02 NOTE — Telephone Encounter (Signed)
Please disregard message below. RX sent

## 2022-11-02 NOTE — Telephone Encounter (Signed)
Please see previous encounter

## 2022-12-07 ENCOUNTER — Other Ambulatory Visit: Payer: Self-pay | Admitting: Family Medicine

## 2022-12-15 DIAGNOSIS — H353211 Exudative age-related macular degeneration, right eye, with active choroidal neovascularization: Secondary | ICD-10-CM | POA: Diagnosis not present

## 2022-12-15 DIAGNOSIS — H35373 Puckering of macula, bilateral: Secondary | ICD-10-CM | POA: Diagnosis not present

## 2023-01-01 ENCOUNTER — Other Ambulatory Visit: Payer: Self-pay | Admitting: Family Medicine

## 2023-01-05 ENCOUNTER — Other Ambulatory Visit: Payer: Self-pay | Admitting: Family Medicine

## 2023-01-06 ENCOUNTER — Other Ambulatory Visit: Payer: Self-pay | Admitting: Family Medicine

## 2023-01-27 ENCOUNTER — Other Ambulatory Visit: Payer: Self-pay | Admitting: Family Medicine

## 2023-02-28 ENCOUNTER — Other Ambulatory Visit: Payer: Self-pay | Admitting: Family Medicine

## 2023-03-01 ENCOUNTER — Telehealth: Payer: Self-pay

## 2023-03-01 NOTE — Telephone Encounter (Signed)
 Copied from CRM [id number]. Topic: Clinical - Medication Question >> Mar 01, 2023  4:07 PM Joanell B wrote: Reason for CRM: Pt wife Santana called an stated that the pharmacy informed her that in order for her to come pick up all his refills at the same time they would all need to be put on a 90 day supply. She said they live far and don't want to make multiple trips.

## 2023-03-03 NOTE — Telephone Encounter (Signed)
 Patient's wife informed patient needs to schedule CPE for 90 day supply

## 2023-03-04 DIAGNOSIS — Z1283 Encounter for screening for malignant neoplasm of skin: Secondary | ICD-10-CM | POA: Diagnosis not present

## 2023-03-04 DIAGNOSIS — D225 Melanocytic nevi of trunk: Secondary | ICD-10-CM | POA: Diagnosis not present

## 2023-03-09 ENCOUNTER — Encounter: Payer: Self-pay | Admitting: Family Medicine

## 2023-03-09 ENCOUNTER — Ambulatory Visit (INDEPENDENT_AMBULATORY_CARE_PROVIDER_SITE_OTHER): Payer: Medicare PPO | Admitting: Family Medicine

## 2023-03-09 VITALS — BP 132/74 | HR 77 | Temp 97.5°F | Ht 70.0 in | Wt 190.4 lb

## 2023-03-09 DIAGNOSIS — E785 Hyperlipidemia, unspecified: Secondary | ICD-10-CM

## 2023-03-09 DIAGNOSIS — Z0000 Encounter for general adult medical examination without abnormal findings: Secondary | ICD-10-CM

## 2023-03-09 LAB — LIPID PANEL
Cholesterol: 138 mg/dL (ref 0–200)
HDL: 43.6 mg/dL (ref >39.00)
LDL Cholesterol: 67 mg/dL (ref 0–99)
NonHDL: 94.41
Total CHOL/HDL Ratio: 3
Triglycerides: 135 mg/dL (ref 0.0–149.0)
VLDL: 27 mg/dL (ref 0.0–40.0)

## 2023-03-09 LAB — COMPREHENSIVE METABOLIC PANEL
ALT: 20 U/L (ref 0–53)
AST: 20 U/L (ref 0–37)
Albumin: 4.3 g/dL (ref 3.5–5.2)
Alkaline Phosphatase: 105 U/L (ref 39–117)
BUN: 27 mg/dL — ABNORMAL HIGH (ref 6–23)
CO2: 28 meq/L (ref 19–32)
Calcium: 9.3 mg/dL (ref 8.4–10.5)
Chloride: 107 meq/L (ref 96–112)
Creatinine, Ser: 1.02 mg/dL (ref 0.40–1.50)
GFR: 68.49 mL/min (ref >60.00)
Glucose, Bld: 102 mg/dL — ABNORMAL HIGH (ref 70–99)
Potassium: 4 meq/L (ref 3.5–5.1)
Sodium: 140 meq/L (ref 135–145)
Total Bilirubin: 0.4 mg/dL (ref 0.2–1.2)
Total Protein: 6.5 g/dL (ref 6.0–8.3)

## 2023-03-09 LAB — CBC WITH DIFFERENTIAL/PLATELET
Basophils Absolute: 0 10*3/uL (ref 0.0–0.1)
Basophils Relative: 0.7 % (ref 0.0–3.0)
Eosinophils Absolute: 0.1 10*3/uL (ref 0.0–0.7)
Eosinophils Relative: 2.2 % (ref 0.0–5.0)
HCT: 39.6 % (ref 39.0–52.0)
Hemoglobin: 13.6 g/dL (ref 13.0–17.0)
Lymphocytes Relative: 35 % (ref 12.0–46.0)
Lymphs Abs: 1.4 10*3/uL (ref 0.7–4.0)
MCHC: 34.4 g/dL (ref 30.0–36.0)
MCV: 91.2 fL (ref 78.0–100.0)
Monocytes Absolute: 0.4 10*3/uL (ref 0.1–1.0)
Monocytes Relative: 9.9 % (ref 3.0–12.0)
Neutro Abs: 2.1 10*3/uL (ref 1.4–7.7)
Neutrophils Relative %: 52.2 % (ref 43.0–77.0)
Platelets: 151 10*3/uL (ref 150.0–400.0)
RBC: 4.34 Mil/uL (ref 4.22–5.81)
RDW: 12.7 % (ref 11.5–15.5)
WBC: 4.1 10*3/uL (ref 4.0–10.5)

## 2023-03-09 LAB — HEMOGLOBIN A1C: Hgb A1c MFr Bld: 5.3 % (ref 4.6–6.5)

## 2023-03-09 MED ORDER — LOSARTAN POTASSIUM 50 MG PO TABS: ORAL_TABLET | ORAL | 3 refills | Status: AC

## 2023-03-09 MED ORDER — ATORVASTATIN CALCIUM 20 MG PO TABS: ORAL_TABLET | ORAL | 3 refills | Status: DC

## 2023-03-09 MED ORDER — AMLODIPINE BESYLATE 10 MG PO TABS: ORAL_TABLET | ORAL | 3 refills | Status: DC

## 2023-03-09 NOTE — Progress Notes (Signed)
Established Patient Office Visit  Subjective   Patient ID: Jose King, male    DOB: [birth year]  Age: 83 y.o. MRN: [id number]  Chief Complaint  Patient presents with   Annual Exam    HPI   Jose King is seen today for annual physical exam.  He has history of hypertension, hyperlipidemia, adenomatous colon polyps.  His mom died of colon cancer age 53.  He is aged out of further colonoscopies.  Generally doing well.  No specific complaints.  He is followed by dermatologist every 6 months.  He has hypertension treated with losartan and amlodipine and stable.  Takes atorvastatin 20 mg daily for hyperlipidemia.  Health maintenance reviewed:  Health Maintenance  Topic Date Due   Medicare Annual Wellness (AWV)  03/17/2023   COVID-19 Vaccine (5 - 2024-25 season) 03/25/2023 (Originally 10/18/2022)   DTaP/Tdap/Td (3 - Td or Tdap) 06/18/2025   Pneumonia Vaccine 39+ Years old  Completed   INFLUENZA VACCINE  Completed   Zoster Vaccines- Shingrix  Completed   HPV VACCINES  Aged Out   -He reportedly had RSV vaccine back in fall 2023.  Other vaccines up-to-date  Social history-he is married.  No children.  Retired Engineer, site and principal.  His wife is also a Runner, broadcasting/film/video.  He never smoked.  No alcohol.   Family history-'s father had MI at age 22 and died of that.  His mother died age 39 of colon cancer.  He has a sister who is alive.  Couple brothers that died 1 of suicide and the other died of pancreatic cancer.  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 repeat 5 years   COLONOSCOPY  2003   RMR: normal   COLONOSCOPY  1999   RMR: adenomatous ICV   COLONOSCOPY N/A 05/09/2014   Procedure: COLONOSCOPY;  Surgeon: Corbin Ade, MD;  [facility];  Service: Endoscopy;  Laterality: N/A;  9:15am   COLONOSCOPY N/A 06/09/2019   Procedure: COLONOSCOPY;  Surgeon: Corbin Ade, MD;  [facility];  Service: Endoscopy;  Laterality: N/A;  9:45am   EYE SURGERY  12/2018   cataract    INGUINAL HERNIA REPAIR  1985   POLYPECTOMY  06/09/2019   Procedure: POLYPECTOMY;  Surgeon: Corbin Ade, MD;  [facility];  Service: Endoscopy;;   PROSTATE SURGERY     TONSILLECTOMY AND ADENOIDECTOMY  1948    reports that he has never smoked. He has never used smokeless  tobacco. He reports that he does not drink alcohol and does not use drugs. family history includes Cancer in his paternal grandfather, paternal grandmother, and other family members; Cancer (age of onset: 34) in his mother; Cancer (age of onset: 55) in his brother; Drug abuse in his maternal aunt; Heart attack (age of onset: 63) in his father; Heart disease in his father, paternal uncle, paternal uncle, and paternal uncle; Lymphoma in his brother; Suicidality in his brother; Vision loss in his paternal uncle. No Known Allergies  Review of Systems  Constitutional:  Negative for chills, fever and malaise/fatigue.  HENT:         He has chronic hearing loss left ear  Eyes:  Negative for blurred vision.  Respiratory:  Negative for shortness of breath.    Cardiovascular:  Negative for chest pain.  Gastrointestinal:  Negative for abdominal pain.  Neurological:  Negative for dizziness, weakness and headaches.      Objective:     BP 132/74 (BP Location: Left Arm, Patient Position: Sitting, Cuff Size: Large)   Pulse 77   Temp (!) 97.5 F (36.4 C) (Oral)   Ht 5\' 10"  (1.778 m)   Wt 190 lb 6.4 oz (86.4 kg)   SpO2 97%   BMI 27.32 kg/m  BP Readings from Last 3 Encounters:  03/09/23 132/74  09/16/22 118/69  02/06/22 128/72   Wt Readings from Last 3 Encounters:  03/09/23 190 lb 6.4 oz (86.4 kg)  09/16/22 182 lb (82.6 kg)  03/16/22 182 lb (82.6 kg)      Physical Exam Vitals reviewed.  Constitutional:      General: He is not in acute distress.    Appearance: He is well-developed. He is not ill-appearing.  HENT:     Right Ear: Tympanic membrane and external ear normal.     Left Ear: Tympanic membrane and external ear normal.  Eyes:     Pupils: Pupils are equal, round, and reactive to light.  Neck:     Thyroid: No thyromegaly.  Cardiovascular:     Rate and Rhythm: Normal rate and regular rhythm.  Pulmonary:     Effort: Pulmonary effort is normal. No respiratory distress.     Breath sounds: Normal breath sounds. No wheezing or rales.  Abdominal:     Palpations: Abdomen is soft. There is no mass.     Tenderness: There is no abdominal tenderness. There is no guarding or rebound.  Musculoskeletal:     Cervical back: Neck supple.     Right lower leg: No edema.     Left lower leg: No edema.  Neurological:     General: No focal deficit present.     Mental Status: He is alert and oriented to person, place, and time.  Psychiatric:        Mood and Affect: Mood normal.        Thought Content: Thought content normal.      No results found for any visits on 03/09/23.  Last CBC Lab Results  Component Value Date   WBC 5.3 02/02/2021   HGB 14.8 02/02/2021   HCT 43.2 02/02/2021   MCV 91.1 02/02/2021   MCH 31.2 02/02/2021    RDW 11.9 02/02/2021   PLT 122 (L) 02/02/2021   Last metabolic panel Lab Results  Component Value Date   GLUCOSE 97 07/09/2021   NA 142 07/09/2021   K 4.5 07/09/2021   CL 107 07/09/2021   CO2 29 07/09/2021   BUN 28 (H) 07/09/2021  CREATININE 1.08 07/09/2021   GFR 64.70 07/09/2021   CALCIUM 9.3 07/09/2021   PROT 6.6 07/09/2021   ALBUMIN 4.3 07/09/2021   BILITOT 0.7 07/09/2021   ALKPHOS 99 07/09/2021   AST 17 07/09/2021   ALT 19 07/09/2021   ANIONGAP 8 02/02/2021   Last lipids Lab Results  Component Value Date   CHOL 144 07/09/2021   HDL 45.60 07/09/2021   LDLCALC 69 07/09/2021   TRIG 151.0 (H) 07/09/2021   CHOLHDL 3 07/09/2021      The ASCVD Risk score (Arnett DK, et al., 2019) failed to calculate for the following reasons:   The 2019 ASCVD risk score is only valid for ages 25 to 45    Assessment & Plan:   Physical exam.  83 year old male with history of hypertension which is currently controlled with losartan and amlodipine.  He is on atorvastatin for hyperlipidemia.  We discussed the following health maintenance items  -Flu vaccine already given this fall.  Continue annual flu vaccine. -He reports receiving RSV vaccine back in 2023 -Pneumonia vaccines complete -Aged out of further colonoscopies -Check labs including lipid, CBC, CMP -Pneumonia vaccines complete -Shingrix already given   No follow-ups on file.    Evelena Peat, MD

## 2023-03-09 NOTE — Patient Instructions (Signed)
Consider RSV vaccine- if you have not already received.   We will call with lab results.

## 2023-03-10 ENCOUNTER — Encounter: Payer: Self-pay | Admitting: Family Medicine

## 2023-03-10 DIAGNOSIS — N400 Enlarged prostate without lower urinary tract symptoms: Secondary | ICD-10-CM

## 2023-03-10 NOTE — Progress Notes (Signed)
  Intake history:  BP (!) 143/90   Pulse 77   Ht 5\' 10"  (1.778 m)   Wt 189 lb 6.4 oz (85.9 kg)   BMI 27.18 kg/m  Body mass index is 27.18 kg/m.    WHAT ARE WE SEEING YOU FOR TODAY?   right knee(s)  How long has this bothered you? (DOI?DOS?WS?)  on 6 mos, worse in past 2 wks  Anticoag.  No  Diabetes No  Heart disease No  Hypertension Yes  SMOKING HX No  Kidney disease No //BUN  27 03/12/23  CMP     Component Value Date/Time   NA 140 03/09/2023 1621   K 4.0 03/09/2023 1621   CL 107 03/09/2023 1621   CO2 28 03/09/2023 1621   GLUCOSE 102 (H) 03/09/2023 1621   BUN 27 (H) 03/09/2023 1621   CREATININE 1.02 03/09/2023 1621   CALCIUM 9.3 03/09/2023 1621   PROT 6.5 03/09/2023 1621   ALBUMIN 4.3 03/09/2023 1621   AST 20 03/09/2023 1621   ALT 20 03/09/2023 1621   ALKPHOS 105 03/09/2023 1621   BILITOT 0.4 03/09/2023 1621   GFR 68.49 03/09/2023 1621   GFRNONAA >60 02/02/2021 1933      Any ALLERGIES ______________________________________________   Treatment:  Have you taken:  Tylenol No  Advil Yes  Had PT No  Had injection Yes 2.5 yrs ago  Other  _________________________

## 2023-03-12 ENCOUNTER — Other Ambulatory Visit (INDEPENDENT_AMBULATORY_CARE_PROVIDER_SITE_OTHER): Payer: Self-pay

## 2023-03-12 ENCOUNTER — Ambulatory Visit: Payer: Medicare PPO | Admitting: Orthopedic Surgery

## 2023-03-12 VITALS — BP 143/90 | HR 77 | Ht 70.0 in | Wt 189.4 lb

## 2023-03-12 DIAGNOSIS — M1711 Unilateral primary osteoarthritis, right knee: Secondary | ICD-10-CM

## 2023-03-12 DIAGNOSIS — G8929 Other chronic pain: Secondary | ICD-10-CM

## 2023-03-12 MED ORDER — METHYLPREDNISOLONE ACETATE 40 MG/ML IJ SUSP
40.0000 mg | Freq: Once | INTRAMUSCULAR | Status: AC
  Administered 2023-03-12: 40 mg via INTRA_ARTICULAR

## 2023-03-12 NOTE — Addendum Note (Signed)
Addended by: Baird Kay on: 03/12/2023 09:59 AM   Modules accepted: Orders

## 2023-03-12 NOTE — Progress Notes (Signed)
Patient ID: Jose King, male   DOB: 1940-08-24, 83 y.o.   MRN: [id number]  Chief Complaint  Patient presents with   Knee Pain    R w/ an old injury 40 yrs ago. Received shot and has been doing well until 6 mos ago when the pain started back. Worse in the past 2 wks.     Current Outpatient Medications  Medication Instructions   amLODipine (NORVASC) 10 MG tablet TAKE 1 TABLET(10 MG) BY MOUTH DAILY   atorvastatin (LIPITOR) 20 MG tablet TAKE 1 TABLET(20 MG) BY MOUTH DAILY   calcium carbonate (OS-CAL - DOSED IN MG OF ELEMENTAL CALCIUM) 1250 (500 Ca) MG tablet 1 tablet, 3 times weekly   LORazepam (ATIVAN) 0.5 mg, Oral, Every 6 hours PRN   losartan (COZAAR) 50 MG tablet TAKE 1 TABLET(50 MG) BY MOUTH DAILY   Multiple Vitamins-Minerals (PRESERVISION AREDS PO) 1 tablet, 2 times daily   Naproxen Sodium (ALEVE PO) Take by mouth.    No Known Allergies   83 year old male history of osteoarthritis of the right knee primarily lateral compartment had an injection 2 years ago by Dr. Hilda Lias did well for 2 years.  He was also taking Aleve 1 twice a day and has been doing that.  However, he is complaining of difficulty going up and down stairs increased pain, limp, swelling.    Review of Systems  Constitutional:  Negative for chills, fever and weight loss.  Respiratory:  Negative for shortness of breath.   Cardiovascular:  Negative for chest pain.  Neurological:  Negative for tingling.    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 repeat 5 years   COLONOSCOPY  2003   RMR: normal   COLONOSCOPY  1999   RMR: adenomatous ICV   COLONOSCOPY N/A 05/09/2014   Procedure: COLONOSCOPY;  Surgeon: Corbin Ade, MD;  [facility];  Service: Endoscopy;  Laterality: N/A;  9:15am   COLONOSCOPY N/A 06/09/2019   Procedure: COLONOSCOPY;  Surgeon: Corbin Ade, MD;  [facility];  Service: Endoscopy;  Laterality: N/A;  9:45am   EYE SURGERY  12/2018   cataract    INGUINAL HERNIA REPAIR  1985   POLYPECTOMY  06/09/2019   Procedure: POLYPECTOMY;  Surgeon: Corbin Ade, MD;  [facility];  Service: Endoscopy;;   PROSTATE SURGERY     TONSILLECTOMY AND ADENOIDECTOMY  1948    PHYSICAL EXAM  BP (!) 143/90   Pulse 77  Ht 5\' 10"  (1.778 m)   Wt 189 lb 6.4 oz (85.9 kg)   BMI 27.18 kg/m  GENERAL appearance reveals no gross abnormalities, normal development grooming and hygiene   MENTAL STATUS we note that the patient is awake alert and oriented to person place and time MOOD/AFFECT ARE NORMAL   GAIT reveals mild limp in the effected limb  Ortho Exam VASC 2+ dorsalis pedis pulse normal capillary refill excellent warmth to the extremity  NEURO normal sensation and no pathologic reflexes  LYMPH deferred noncontributory  MEDICAL DECISION MAKING  A.  Encounter Diagnosis  Name Primary?   Chronic pain of right knee Yes    B. DATA ANALYSED:  Prior ortho notes from DR K  IMAGING: Independent interpretation of images:  DG Knee AP/LAT W/Sunrise Right Result Date: 03/12/2023 Imaging right knee  history of OA Increased pain X-rays show increasing valgus deformity of the right knee compared to x-rays taken in 2022 the lateral compartment shows subchondral sclerosis cyst formation osteophyte formation and extensive narrowing.  Tello femoral arthritis also noted but the patella is centered on the axial x-ray Impression worsening osteoarthritis of the right knee with valgus of 9 degrees     C. MANAGEMENT   Knee injx   Procedure note right knee injection   verbal consent was obtained to inject right knee joint  Timeout was completed to confirm the site of injection  The medications used were depomedrol 40 mg and 1% lidocaine 3 cc Anesthesia was provided by ethyl chloride and the skin was prepped with alcohol.  After cleaning the skin with alcohol a 20-gauge needle was used to inject the right knee joint. There were no complications. A sterile bandage was applied.   No orders of the defined types were placed in this encounter.

## 2023-03-15 ENCOUNTER — Encounter: Payer: Self-pay | Admitting: Urology

## 2023-03-15 ENCOUNTER — Ambulatory Visit: Payer: Medicare PPO | Admitting: Urology

## 2023-03-15 VITALS — BP 167/76 | HR 76

## 2023-03-15 DIAGNOSIS — N400 Enlarged prostate without lower urinary tract symptoms: Secondary | ICD-10-CM

## 2023-03-15 DIAGNOSIS — R3912 Poor urinary stream: Secondary | ICD-10-CM

## 2023-03-15 LAB — BLADDER SCAN AMB NON-IMAGING: Scan Result: 10

## 2023-03-15 LAB — URINALYSIS, ROUTINE W REFLEX MICROSCOPIC
Bilirubin, UA: NEGATIVE
Glucose, UA: NEGATIVE
Leukocytes,UA: NEGATIVE
Nitrite, UA: NEGATIVE
Protein,UA: NEGATIVE
RBC, UA: NEGATIVE
Specific Gravity, UA: 1.025 (ref 1.005–1.030)
Urobilinogen, Ur: 0.2 mg/dL (ref 0.2–1.0)
pH, UA: 6 (ref 5.0–7.5)

## 2023-03-15 NOTE — Progress Notes (Signed)
03/15/2023 10:16 AM   Jose King 1940/03/12 [id number]  Referring provider: Kristian Covey, MD [address] [place],  [place] [address]  Difficulty urinating   HPI: Jose King is a 82yo here for evaluation of difficulty urinating. IPSS 16 QOl 3 on no BPh therapy. He has a feeling a incomplete emptying. He has urinary hesitancy which is bothersome. Nocturia 1x. He has intermittent straining to urinate.    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 repeat 5 years   COLONOSCOPY  2003   RMR: normal   COLONOSCOPY  1999   RMR: adenomatous ICV   COLONOSCOPY N/A 05/09/2014   Procedure: COLONOSCOPY;  Surgeon: Corbin Ade, MD;  [facility];  Service: Endoscopy;  Laterality: N/A;  9:15am   COLONOSCOPY N/A 06/09/2019   Procedure: COLONOSCOPY;  Surgeon: Corbin Ade, MD;  [facility];  Service: Endoscopy;  Laterality: N/A;  9:45am   EYE SURGERY  12/2018   cataract    INGUINAL HERNIA REPAIR  1985   POLYPECTOMY  06/09/2019   Procedure: POLYPECTOMY;  Surgeon: Corbin Ade, MD;  [facility];  Service: Endoscopy;;   PROSTATE SURGERY     TONSILLECTOMY AND ADENOIDECTOMY  1948    Home Medications:  Allergies as of 03/15/2023   No Known Allergies      Medication List        Accurate as of March 15, 2023 10:16 AM. If you have any questions, ask your nurse or doctor.          ALEVE PO Take by mouth.   amLODipine 10 MG tablet Commonly known as: NORVASC TAKE 1 TABLET(10 MG) BY MOUTH DAILY   atorvastatin 20 MG tablet Commonly known as: LIPITOR TAKE 1 TABLET(20 MG) BY MOUTH DAILY   calcium carbonate 1250 (500 Ca) MG tablet Commonly known as: OS-CAL - dosed in mg of elemental calcium Take 1 tablet by mouth 3 (three) times a week.   LORazepam 0.5 MG tablet Commonly known as: Ativan Take 1 tablet (0.5 mg total) by mouth every 6 (six) hours as needed (Dizziness).   losartan 50 MG tablet Commonly known as: COZAAR TAKE 1 TABLET(50 MG) BY MOUTH DAILY   PRESERVISION AREDS PO Take 1 tablet by mouth in the morning and at bedtime.        Allergies: No Known Allergies  Family History: Family History  Problem Relation Age of Onset  Cancer Mother 98       colon   Heart attack Father 82   Heart disease Father    Suicidality Brother    Cancer Brother 53       Pancreatic   Lymphoma Brother    Drug abuse Maternal Aunt    Vision loss Paternal Uncle        Massive Stroke   Heart disease Paternal Uncle    Cancer Other        grandfather, mouth   Cancer Other        grandmother, intestional    Cancer Paternal Grandfather    Cancer Paternal Grandmother    Heart disease Paternal Uncle    Heart disease Paternal Uncle     Social History:  reports that he has never smoked. He has never used smokeless tobacco. He reports that he does not drink alcohol and does not use drugs.  ROS: All other review of systems were reviewed and are negative except what is noted above in HPI  Physical Exam: BP (!) 167/76   Pulse 76    Constitutional:  Alert and oriented, No acute distress. HEENT: [place] AT, moist mucus membranes.  Trachea midline, no masses. Cardiovascular: No clubbing, cyanosis, or edema. Respiratory: Normal respiratory effort, no increased work of breathing. GI: Abdomen is soft, nontender, nondistended, no abdominal masses GU: No CVA tenderness. Circumcised phallus. No masses/lesions on penis, testis, scrotum. Prostate 60g smooth no nodules no induration.  Lymph: No cervical or inguinal lymphadenopathy. Skin: No rashes, bruises or suspicious lesions. Neurologic: Grossly intact, no focal deficits, moving all 4 extremities. Psychiatric: Normal mood and affect.  Laboratory Data: Lab Results  Component Value Date   WBC 4.1 03/09/2023   HGB 13.6 03/09/2023   HCT 39.6 03/09/2023   MCV 91.2 03/09/2023   PLT 151.0 03/09/2023    Lab Results  Component Value Date   CREATININE 1.02 03/09/2023    Lab Results  Component Value Date   PSA 3.85 05/01/2019   PSA 3.91 11/09/2018   PSA 2.79 06/12/2015    No results found for: "TESTOSTERONE"  Lab Results  Component Value Date   HGBA1C 5.3 03/09/2023    Urinalysis No results found for: "COLORURINE", "APPEARANCEUR", "LABSPEC", "PHURINE", "GLUCOSEU", "HGBUR", "BILIRUBINUR", "KETONESUR", "PROTEINUR", "UROBILINOGEN", "NITRITE", "LEUKOCYTESUR"  No results found for: "LABMICR", "WBCUA", "RBCUA", "LABEPIT", "MUCUS", "BACTERIA"  Pertinent Imaging:  No results found for this or any previous visit.  No results found for this or any previous visit.  No results found for this or any previous visit.  No results found for this or any previous visit.  No results found for this or any previous visit.  No results found for this or any previous visit.  No results found for this or any previous visit.  No results found for this or any previous visit.   Assessment & Plan:    1. Benign prostatic hyperplasia, unspecified whether lower urinary tract symptoms  present (Primary) We discussed the management of his BPH including continued medical therapy, Rezum, Urolift, TURP and simple prostatectomy. After discussing the options the patient has elected to proceed with observation. Risks/benefits/alternatives discussed.  - Urinalysis, Routine w reflex microscopic - BLADDER SCAN AMB NON-IMAGING  2. Weak urinary stream -patient defers therapy at this time.   No follow-ups on file.  Wilkie Aye, MD  [facility] [place]

## 2023-03-15 NOTE — Patient Instructions (Signed)

## 2023-03-15 NOTE — Progress Notes (Signed)
post void residual=10

## 2023-03-16 LAB — PSA: Prostate Specific Ag, Serum: 5.1 ng/mL — ABNORMAL HIGH (ref 0.0–4.0)

## 2023-03-24 ENCOUNTER — Ambulatory Visit: Payer: Medicare PPO

## 2023-03-24 VITALS — Ht 70.0 in | Wt 189.0 lb

## 2023-03-24 DIAGNOSIS — Z0000 Encounter for general adult medical examination without abnormal findings: Secondary | ICD-10-CM

## 2023-03-24 NOTE — Progress Notes (Signed)
 Subjective:   Jose King is a 83 y.o. male who presents for Medicare Annual/Subsequent preventive examination.  Visit Complete: Virtual I connected with  Jose King on 03/24/23 by a audio enabled telemedicine application and verified that I am speaking with the correct person using two identifiers.  Patient Location: Home  Provider Location: Home Office  I discussed the limitations of evaluation and management by telemedicine. The patient expressed understanding and agreed to proceed.  Vital Signs: Because this visit was a virtual/telehealth visit, some criteria may be missing or patient reported. Any vitals not documented were not able to be obtained and vitals that have been documented are patient reported.  Patient Medicare AWV questionnaire was completed by the patient on 03/17/23; I have confirmed that all information answered by patient is correct and no changes since this date.  Cardiac Risk Factors include: advanced age (>90men, >34 women);male gender;hypertension     Objective:    Today's Vitals   03/24/23 1127  Weight: 189 lb (85.7 kg)  Height: 5' 10 (1.778 m)   Body mass index is 27.12 kg/m.     03/24/2023   11:38 AM 03/16/2022    9:53 AM 03/13/2021    9:07 AM 02/02/2021    6:45 PM 04/10/2020    9:17 AM 06/09/2019    8:55 AM 01/16/2019   10:07 AM  Advanced Directives  Does Patient Have a Medical Advance Directive? Yes Yes Yes No Yes Yes Yes  Type of Estate Agent of [place];Living will Healthcare Power of [place];Living will Living will  Healthcare Power of [place];Living will Healthcare Power of [place];Living will Living will  Does patient want to make changes to medical advance directive?   No - Patient declined  No - Patient declined    Copy of Healthcare Power of Attorney in Chart? No - copy requested No - copy requested   No - copy requested No - copy requested   Would patient like information on creating a medical  advance directive?    No - Patient declined       Current Medications (verified) Outpatient Encounter Medications as of 03/24/2023  Medication Sig   amLODipine  (NORVASC ) 10 MG tablet TAKE 1 TABLET(10 MG) BY MOUTH DAILY   atorvastatin  (LIPITOR) 20 MG tablet TAKE 1 TABLET(20 MG) BY MOUTH DAILY   calcium  carbonate (OS-CAL - DOSED IN MG OF ELEMENTAL CALCIUM ) 1250 (500 Ca) MG tablet Take 1 tablet by mouth 3 (three) times a week.   LORazepam  (ATIVAN ) 0.5 MG tablet Take 1 tablet (0.5 mg total) by mouth every 6 (six) hours as needed (Dizziness). (Patient not taking: Reported on 03/15/2023)   losartan  (COZAAR ) 50 MG tablet TAKE 1 TABLET(50 MG) BY MOUTH DAILY   Multiple Vitamins-Minerals (PRESERVISION AREDS PO) Take 1 tablet by mouth in the morning and at bedtime.    Naproxen Sodium (ALEVE PO) Take by mouth.   No facility-administered encounter medications on file as of 03/24/2023.    Allergies (verified) Patient has no known allergies.   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 repeat 5 years   COLONOSCOPY  2003   RMR: normal    COLONOSCOPY  1999   RMR: adenomatous ICV   COLONOSCOPY N/A 05/09/2014   Procedure: COLONOSCOPY;  Surgeon: Lamar CHRISTELLA Hollingshead, MD;  [facility];  Service: Endoscopy;  Laterality: N/A;  9:15am   COLONOSCOPY N/A 06/09/2019   Procedure: COLONOSCOPY;  Surgeon: Hollingshead Lamar CHRISTELLA, MD;  Location: [facility];  Service: Endoscopy;  Laterality: N/A;  9:45am   EYE SURGERY  12/2018   cataract    INGUINAL HERNIA REPAIR  1985   POLYPECTOMY  06/09/2019   Procedure: POLYPECTOMY;  Surgeon: Hollingshead Lamar CHRISTELLA, MD;  [facility]: [facility];  Service: Endoscopy;;   PROSTATE SURGERY     TONSILLECTOMY AND ADENOIDECTOMY  1948   Family History  Problem Relation Age of Onset   Cancer Mother 57       colon   Heart attack Father 29   Heart disease Father    Suicidality Brother    Cancer Brother 74       Pancreatic   Lymphoma Brother    Drug abuse Maternal Aunt    Vision loss Paternal Uncle        Massive Stroke   Heart disease Paternal Uncle    Cancer Other        grandfather, mouth   Cancer Other        grandmother, intestional    Cancer Paternal Grandfather    Cancer Paternal Grandmother    Heart disease Paternal Uncle    Heart disease Paternal Uncle    Social History   Socioeconomic History   Marital status: Married    Spouse name: Not on file   Number of children: 0   Years of education: Not on file   Highest education level: Master's degree (e.g., MA, MS, MEng, MEd, MSW, MBA)  Occupational History   Occupation: Retired  Tobacco Use   Smoking status: Never   Smokeless tobacco: Never  Vaping Use   Vaping status: Never Used  Substance and Sexual Activity   Alcohol use: Never   Drug use: Never   Sexual activity: Not Currently    Birth control/protection: None  Other Topics Concern   Not on file  Social History Narrative   Married   Gets regular exercise from farm work   Social Drivers of Corporate Investment Banker Strain: Low Risk  (03/24/2023)   Overall Financial Resource  Strain (CARDIA)    Difficulty of Paying Living Expenses: Not hard at all  Food Insecurity: No Food Insecurity (03/24/2023)   Hunger Vital Sign    Worried About Running Out of Food in the Last Year: Never true    Ran Out of Food in the Last Year: Never true  Transportation Needs: No Transportation Needs (03/24/2023)   PRAPARE - Administrator, Civil Service (Medical): No    Lack of Transportation (  Non-Medical): No  Physical Activity: Sufficiently Active (03/24/2023)   Exercise Vital Sign    Days of Exercise per Week: 7 days    Minutes of Exercise per Session: 30 min  Stress: No Stress Concern Present (03/24/2023)   [employer] of Occupational Health - Occupational Stress Questionnaire    Feeling of Stress : Not at all  Social Connections: Socially Integrated (03/24/2023)   Social Connection and Isolation Panel [NHANES]    Frequency of Communication with Friends and Family: More than three times a week    Frequency of Social Gatherings with Friends and Family: More than three times a week    Attends Religious Services: More than 4 times per year    Active Member of [employer] or Organizations: Yes    Attends Engineer, Structural: More than 4 times per year    Marital Status: Married    Tobacco Counseling Counseling given: Not Answered   Clinical Intake:  Pre-visit preparation completed: Yes  Pain : No/denies pain     BMI - recorded: 27.12 Nutritional Status: BMI 25 -29 Overweight Nutritional Risks: None Diabetes: No  How often do you need to have someone help you when you read instructions, pamphlets, or other written materials from your doctor or pharmacy?: 1 - Never  Interpreter Needed?: No  Information entered by :: Rojelio Blush LPN   Activities of Daily Living    03/24/2023   11:35 AM 03/17/2023    1:29 PM  In your present state of health, do you have any difficulty performing the following activities:  Hearing? 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 1 1  Comment Followed by medical attention   Difficulty concentrating or making decisions? 0 0  Walking or climbing stairs? 0 0  Dressing or bathing? 0 0  Doing errands, shopping? 0 0  Preparing Food and eating ? N N  Using the Toilet? N N  In the past six months, have you accidently leaked urine? Y Y  Comment Followed by medical attention   Do you have problems with loss of bowel control? N N  Managing your Medications? N N  Managing your Finances? N N  Housekeeping or managing your Housekeeping? N N    Patient Care Team: Micheal Wolm ORN, MD as PCP - General (Family Medicine) Shaaron Lamar HERO, MD as Consulting Physician (Gastroenterology) Livingston Rigg, MD (Inactive) as Consulting Physician (Dermatology)  Indicate any recent Medical Services you may have received from other than [facility] providers in the past year (date may be approximate).     Assessment:   This is a routine wellness examination for [place].  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       Depression Screen    03/24/2023   11:32 AM 03/09/2023    4:23 PM 03/16/2022    9:51 AM 02/06/2022    8:26 AM 03/13/2021    9:00 AM 02/05/2021    2:28 PM 04/10/2020    9:20 AM  PHQ 2/9 Scores  PHQ - 2 Score 0 0 0 0 0 0 0  PHQ- 9 Score 0 0 0 0       Fall Risk    03/24/2023   11:37 AM 03/17/2023    1:29 PM 03/09/2023    4:23 PM 03/16/2022    9:52 AM 03/13/2022    3:41 PM  Fall Risk   Falls in the past year? 0 0 0 0 0  Number falls in past yr: 0 0 0 0 0  Injury with Fall? 0 0 0 0   Risk for fall due to : No Fall Risks   No Fall Risks   Follow up Falls prevention discussed;Falls evaluation completed   Falls  prevention discussed     MEDICARE RISK AT HOME: Medicare Risk at Home Any stairs in or around the home?: Yes If so, are there any without handrails?: No Home free of loose throw rugs in walkways, pet beds, electrical cords, etc?: Yes Adequate lighting in your home to reduce risk of falls?: Yes Life alert?: No Use of a cane, walker or w/c?: No Grab bars in the bathroom?: No Shower chair or bench in shower?: Yes Elevated toilet seat or a handicapped toilet?: No  TIMED UP AND GO:  Was the test performed?  No    Cognitive Function:    12/22/2017    8:37 AM 06/19/2016    8:36 AM  MMSE - Mini Mental State Exam  Not completed: -- --        03/24/2023   11:38 AM 03/16/2022    9:53 AM 03/13/2021    9:05 AM 06/19/2016    8:36 AM  6CIT Screen  What Year? 0 points 0 points 0 points 0 points  What month? 0 points 0 points 0 points 0 points  What time? 0 points 0 points 0 points 0 points  Count back from 20 0 points 0 points 0 points 0 points  Months in reverse 0 points 0 points 0 points 0 points  Repeat phrase 0 points 0 points 0 points 0 points  Total Score 0 points 0 points 0 points 0 points    Immunizations Immunization History  Administered Date(s) Administered   Fluad Quad(high Dose 65+) 11/18/2020, 11/07/2021   Influenza, High Dose Seasonal PF 11/18/2017, 11/02/2018   Influenza-Unspecified 11/14/2016, 12/01/2019, 10/28/2022   PFIZER(Purple Top)SARS-COV-2 Vaccination 03/03/2019, 03/24/2019, 10/19/2019   Pfizer Covid-19 Vaccine Bivalent Booster 72yrs & up 11/18/2020   Pneumococcal Conjugate-13 12/13/2013   Tdap 11/14/2013, 06/19/2015   Zoster Recombinant(Shingrix) 06/26/2016, 11/15/2016    TDAP status: Up to date  Flu Vaccine status: Up to date  Pneumococcal vaccine status: Up to date  Covid-19 vaccine status: Declined, Education has been provided regarding the importance of this vaccine but patient still declined. Advised may receive this vaccine at local pharmacy or  Health Dept.or vaccine clinic. Aware to provide a copy of the vaccination record if obtained from local pharmacy or Health Dept. Verbalized acceptance and understanding.  Qualifies for Shingles Vaccine? Yes   Zostavax completed Yes   Shingrix Completed?: Yes  Screening Tests Health Maintenance  Topic Date Due   COVID-19 Vaccine (5 - 2024-25 season) 03/25/2023 (Originally 10/18/2022)   Medicare Annual Wellness (AWV)  03/23/2024   DTaP/Tdap/Td (3 - Td or Tdap) 06/18/2025   Pneumonia Vaccine 60+ Years old  Completed   INFLUENZA VACCINE  Completed   Zoster Vaccines- Shingrix  Completed   HPV VACCINES  Aged Out    Health Maintenance  There are no preventive care reminders to display for this patient.  Additional Screening:    Vision Screening: Recommended annual ophthalmology exams for early detection of glaucoma and other disorders of the eye. Is the patient up to date with their annual eye exam?  Yes  Who is the provider or what is the name of the office in which the patient attends annual eye exams? Dr Maree If pt is not established with a provider, would they like to be referred to a provider to establish care? No .   Dental Screening: Recommended annual dental exams for proper oral hygiene    Community Resource Referral / Chronic Care Management:  CRR required this visit?  No   CCM required this visit?  No     Plan:     I have personally reviewed and noted the following in the patient's chart:   Medical and social history Use of alcohol, tobacco or illicit drugs  Current medications and supplements including opioid prescriptions. Patient is not currently taking opioid prescriptions. Functional ability and status Nutritional status Physical activity Advanced directives List of other physicians Hospitalizations, surgeries, and ER visits in previous 12 months Vitals Screenings to include cognitive, depression, and falls Referrals and appointments  In  addition, I have reviewed and discussed with patient certain preventive protocols, quality metrics, and best practice recommendations. A written personalized care plan for preventive services as well as general preventive health recommendations were provided to patient.     Rojelio LELON Blush, LPN   08/19/7972   After Visit Summary: (MyChart) Due to this being a telephonic visit, the after visit summary with patients personalized plan was offered to patient via MyChart   Nurse Notes: None

## 2023-03-24 NOTE — Patient Instructions (Addendum)
 Mr. Chalk , Thank you for taking time to come for your Medicare Wellness Visit. I appreciate your ongoing commitment to your health goals. Please review the following plan we discussed and let me know if I can assist you in the future.   Referrals/Orders/Follow-Ups/Clinician Recommendations:   This is a list of the screening recommended for you and due dates:  Health Maintenance  Topic Date Due   COVID-19 Vaccine (5 - 2024-25 season) 03/25/2023*   Medicare Annual Wellness Visit  03/23/2024   DTaP/Tdap/Td vaccine (3 - Td or Tdap) 06/18/2025   Pneumonia Vaccine  Completed   Flu Shot  Completed   Zoster (Shingles) Vaccine  Completed   HPV Vaccine  Aged Out  *Topic was postponed. The date shown is not the original due date.    Advanced directives: (Copy Requested) Please bring a copy of your health care power of attorney and living will to the office to be added to your chart at your convenience.  Next Medicare Annual Wellness Visit scheduled for next year: Yes

## 2023-04-19 DIAGNOSIS — J069 Acute upper respiratory infection, unspecified: Secondary | ICD-10-CM | POA: Diagnosis not present

## 2023-04-19 DIAGNOSIS — Z209 Contact with and (suspected) exposure to unspecified communicable disease: Secondary | ICD-10-CM | POA: Diagnosis not present

## 2023-06-01 DIAGNOSIS — H353211 Exudative age-related macular degeneration, right eye, with active choroidal neovascularization: Secondary | ICD-10-CM | POA: Diagnosis not present

## 2023-06-01 DIAGNOSIS — H35373 Puckering of macula, bilateral: Secondary | ICD-10-CM | POA: Diagnosis not present

## 2023-10-08 ENCOUNTER — Encounter: Payer: Self-pay | Admitting: Radiology

## 2023-11-02 ENCOUNTER — Encounter: Payer: Self-pay | Admitting: Family Medicine

## 2023-11-02 ENCOUNTER — Ambulatory Visit: Admitting: Family Medicine

## 2023-11-02 VITALS — BP 125/68 | HR 76 | Temp 97.9°F | Resp 16 | Ht 70.0 in | Wt 187.0 lb

## 2023-11-02 DIAGNOSIS — I10 Essential (primary) hypertension: Secondary | ICD-10-CM

## 2023-11-02 DIAGNOSIS — S46211A Strain of muscle, fascia and tendon of other parts of biceps, right arm, initial encounter: Secondary | ICD-10-CM

## 2023-11-02 NOTE — Patient Instructions (Addendum)
 A few things to remember from today's visit:  Essential hypertension  Biceps rupture, distal, right, initial encounter  No changes today. Let me know if interested in ortho referral.  Do not use My Chart to request refills or for acute issues that need immediate attention. If you send a my chart message, it may take a few days to be addressed, specially if I am not in the office.  Please be sure medication list is accurate. If a new problem present, please set up appointment sooner than planned today.

## 2023-11-02 NOTE — Progress Notes (Signed)
 ACUTE VISIT Chief Complaint  Patient presents with   Acute Visit    Muscle pain on right side     Discussed the use of AI scribe software for clinical note transcription with the patient, who gave verbal consent to proceed.  History of Present Illness Jose King is an 83 year old right-handed male with PMHx significant for HTN and HLD who presents with right shoulder/arm pain and deformity following an injury.  Approximately five to six weeks ago, he experienced a sharp pain in his right proximal arm while gardening. He picked up a rock weighing three to four pounds and lobbed it, feeling as though he tore the muscle. The area swelled, turned black and blue, and eventually developed a noticeable knot. Treated area with local ice.  The initial sharp pain has subsided to discomfort, particularly at night when trying to find a comfortable position to sleep. He describes the sensation as 'uncomfortable' rather than painful. There is no limitation in movement, and he is able to perform activities such as cranking a weed eater or chainsaw.  He has not sought medical attention for this issue until now. He has a history of muscle pulls from playing sports but has never experienced this type of injury before.  No numbness or tingling of RUE although he notes occasional morning numbness in his fingers (index and middle) bilateral, which is a chronic problem, no associated weakness.   BP elevated today. He is on Amlodipine  10 mg daily and Losartan  50 mg daily. States that recently he donated blood and BP was fine. He does not check BP frequently but when he does it is 120's/60's.  Review of Systems  Constitutional:  Negative for activity change, appetite change, chills and fever.  HENT:  Negative for sore throat.   Respiratory:  Negative for cough and shortness of breath.   Cardiovascular:  Negative for chest pain and leg swelling.  Gastrointestinal:  Negative for  abdominal pain and diarrhea.  Neurological:  Negative for weakness.  Psychiatric/Behavioral:  Negative for confusion and hallucinations.   See other pertinent positives and negatives in HPI.  Current Outpatient Medications on File Prior to Visit  Medication Sig Dispense Refill   amLODipine  (NORVASC ) 10 MG tablet TAKE 1 TABLET(10 MG) BY MOUTH DAILY 90 tablet 3   atorvastatin  (LIPITOR) 20 MG tablet TAKE 1 TABLET(20 MG) BY MOUTH DAILY 90 tablet 3   calcium  carbonate (OS-CAL - DOSED IN MG OF ELEMENTAL CALCIUM ) 1250 (500 Ca) MG tablet Take 1 tablet by mouth 3 (three) times a week.     losartan  (COZAAR ) 50 MG tablet TAKE 1 TABLET(50 MG) BY MOUTH DAILY 90 tablet 3   Multiple Vitamins-Minerals (PRESERVISION AREDS PO) Take 1 tablet by mouth in the morning and at bedtime.      LORazepam  (ATIVAN ) 0.5 MG tablet Take 1 tablet (0.5 mg total) by mouth every 6 (six) hours as needed (Dizziness). (Patient not taking: Reported on 03/15/2023) 15 tablet 0   Naproxen Sodium (ALEVE PO) Take by mouth. (Patient not taking: Reported on 11/02/2023)     No current facility-administered medications on file prior to visit.    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/10/2013   Right Cheek   Superficial basal cell carcinoma (BCC) 08/23/2002   V of Neck   No Known Allergies  Social History   Socioeconomic History   Marital status: Married     Spouse name: Not on file   Number of children: 0   Years of education: Not on file   Highest education level: Master's degree (e.g., MA, MS, MEng, MEd, MSW, MBA)  Occupational History   Occupation: Retired  Tobacco Use   Smoking status: Never   Smokeless tobacco: Never  Vaping Use   Vaping status: Never Used  Substance and Sexual Activity   Alcohol use: Never   Drug use: Never   Sexual activity: Not Currently    Birth control/protection: None  Other Topics Concern   Not on file  Social History Narrative   Married   Gets regular exercise from farm work   Social Drivers of Corporate investment banker Strain: Low Risk  (11/01/2023)   Overall Financial Resource Strain (CARDIA)    Difficulty of Paying Living Expenses: Not hard at all  Food Insecurity: No Food Insecurity (11/01/2023)   Hunger Vital Sign    Worried About Running Out of Food in the Last Year: Never true    Ran Out of Food in the Last Year: Never true  Transportation Needs: No Transportation Needs (11/01/2023)   PRAPARE - Administrator, Civil Service (Medical): No    Lack of Transportation (Non-Medical): No  Physical Activity: Sufficiently Active (11/01/2023)   Exercise Vital Sign    Days of Exercise per Week: 5 days    Minutes of Exercise per Session: 90 min  Stress: No Stress Concern Present (11/01/2023)   [employer] of Occupational Health - Occupational Stress Questionnaire    Feeling of Stress: Not at all  Social Connections: Socially Integrated (11/01/2023)   Social Connection and Isolation Panel    Frequency of Communication with Friends and Family: Three times a week    Frequency of Social Gatherings with Friends and Family: Three times a week    Attends Religious Services: More than 4 times per year    Active Member of Clubs or Organizations: Yes    Attends Banker Meetings: More than 4 times per year    Marital Status: Married    Vitals:   11/02/23 1349 11/02/23 1525   BP: (!) 160/80 125/68  Pulse: 76   Resp: 16   Temp: 97.9 F (36.6 C)   SpO2: 96%    Body mass index is 26.83 kg/m.  Physical Exam Vitals and nursing note reviewed.  Constitutional:      General: He is not in acute distress.    Appearance: He is well-developed. He is not ill-appearing.  HENT:     Head: Normocephalic and atraumatic.  Eyes:     Conjunctiva/sclera: Conjunctivae normal.  Neck:     Trachea: No tracheal deviation.  Cardiovascular:     Rate and Rhythm: Normal rate and regular rhythm.     Pulses:          Radial pulses are 2+ on the  right side.     Heart sounds: No murmur heard. Pulmonary:     Effort: Pulmonary effort is normal. No respiratory distress.     Breath sounds: Normal breath sounds.  Musculoskeletal:     Right upper arm: Deformity present.     Comments: Proximal biceps muscle prominence appreciated, asymmetric when compared with left side. No erythema, edema,or ecchymosis. Normal elbow and shoulder ROM.  Lymphadenopathy:     Cervical: No cervical adenopathy.  Skin:    General: Skin is warm.     Findings: Rash present. No erythema.  Neurological:     General: No focal deficit present.     Mental Status: He is alert and oriented to person, place, and time.  Psychiatric:     Comments: Well groomed, good eye contact.        ASSESSMENT AND PLAN:  Mr. Rea was seen today for right arm injury 5-6 weeks ago and biceps deformity.  Biceps rupture, distal, right, initial encounter We discussed Dx and treatment options. Injury happened a few weeks ago, no limitation of ROM; so I do not thinks surgical treatment is necessary. He reports some discomfort in area but no pain, we discussed the option of arranging an ortho consultation but he prefers to hold on it. If he decides to do he can call and let us  know. .  Essential hypertension BP re-checked and normalized. Continue Amlodipine  and Losartan  same dose. Continue monitoring BP regularly.  Return  if symptoms worsen or fail to improve, for keep next appointment.  Glynn Freas G. [place], MD  [facility]. [place] office.

## 2023-11-25 ENCOUNTER — Other Ambulatory Visit: Payer: Self-pay | Admitting: Family Medicine

## 2023-12-20 ENCOUNTER — Encounter: Payer: Self-pay | Admitting: Radiology

## 2024-02-23 ENCOUNTER — Other Ambulatory Visit: Payer: Self-pay | Admitting: Family Medicine

## 2024-03-09 ENCOUNTER — Other Ambulatory Visit: Payer: Self-pay | Admitting: Family Medicine

## 2024-03-15 ENCOUNTER — Ambulatory Visit: Payer: Medicare PPO | Admitting: Urology

## 2024-03-15 ENCOUNTER — Encounter: Payer: Self-pay | Admitting: Urology

## 2024-03-15 VITALS — BP 139/74 | HR 72

## 2024-03-15 DIAGNOSIS — N401 Enlarged prostate with lower urinary tract symptoms: Secondary | ICD-10-CM

## 2024-03-15 DIAGNOSIS — R3912 Poor urinary stream: Secondary | ICD-10-CM

## 2024-03-15 DIAGNOSIS — N400 Enlarged prostate without lower urinary tract symptoms: Secondary | ICD-10-CM

## 2024-03-15 LAB — URINALYSIS, ROUTINE W REFLEX MICROSCOPIC
Bilirubin, UA: NEGATIVE
Glucose, UA: NEGATIVE
Ketones, UA: NEGATIVE
Leukocytes,UA: NEGATIVE
Nitrite, UA: NEGATIVE
Protein,UA: NEGATIVE
RBC, UA: NEGATIVE
Specific Gravity, UA: 1.03 (ref 1.005–1.030)
Urobilinogen, Ur: 0.2 mg/dL (ref 0.2–1.0)
pH, UA: 5.5 (ref 5.0–7.5)

## 2024-03-15 NOTE — Progress Notes (Signed)
 "  03/15/2024 10:29 AM   Jose King 14-Mar-1940 [id number]  Referring provider: Micheal Wolm ORN, MD [address] [place],  [place] [address]  Followup weak stream   HPI: Jose King is a 83yo here for followup for difficulty urinating. IPSS 18 QOl 1 on no BPH therapy. Starting New Year day he developed a hemorrhoid and had blood in his stool which has since resolved. His urine stream is fair. He continues to have a feeling of incomplete emptying. Nocturia 1x. 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 repeat 5 years   COLONOSCOPY  2003   RMR: normal   COLONOSCOPY  1999   RMR: adenomatous ICV   COLONOSCOPY N/A 05/09/2014   Procedure: COLONOSCOPY;  Surgeon: Lamar CHRISTELLA Hollingshead, MD;  Location: [facility];  Service: Endoscopy;  Laterality: N/A;  9:15am   COLONOSCOPY N/A 06/09/2019   Procedure: COLONOSCOPY;  Surgeon: Hollingshead Lamar CHRISTELLA, MD;  Location: [facility];  Service: Endoscopy;  Laterality: N/A;  9:45am   EYE SURGERY  12/2018   cataract    INGUINAL HERNIA REPAIR   1985   POLYPECTOMY  06/09/2019   Procedure: POLYPECTOMY;  Surgeon: Hollingshead Lamar CHRISTELLA, MD;  Location: [facility];  Service: Endoscopy;;   PROSTATE SURGERY     TONSILLECTOMY AND ADENOIDECTOMY  1948    Home Medications:  Allergies as of 03/15/2024   No Known Allergies      Medication List        Accurate as of March 15, 2024 10:29 AM. If you have any questions, ask your nurse or doctor.          ALEVE PO Take by mouth.   amLODipine  10 MG tablet Commonly known as: NORVASC  TAKE 1 TABLET(10 MG) BY MOUTH DAILY   atorvastatin  20 MG tablet Commonly known as: LIPITOR TAKE 1 TABLET(20 MG) BY MOUTH DAILY   calcium  carbonate 1250 (500 Ca) MG tablet Commonly known as: OS-CAL - dosed in mg of elemental calcium  Take 1 tablet by mouth 3 (three) times a week.   losartan  50 MG tablet Commonly known as: COZAAR  TAKE 1 TABLET(50 MG) BY MOUTH DAILY   PRESERVISION AREDS PO Take 1 tablet by mouth in the morning and at bedtime.        Allergies: Allergies[1]  Family History: Family History  Problem Relation Age of Onset   Cancer Mother 81       colon  Heart attack Father 65   Heart disease Father    Suicidality Brother    Cancer Brother 49       Pancreatic   Lymphoma Brother    Drug abuse Maternal Aunt    Vision loss Paternal Uncle        Massive Stroke   Heart disease Paternal Uncle    Cancer Other        grandfather, mouth   Cancer Other        grandmother, intestional    Cancer Paternal Grandfather    Cancer Paternal Grandmother    Heart disease Paternal Uncle    Heart disease Paternal Uncle     Social History:  reports that he has never smoked. He has never used smokeless tobacco. He reports that he does not drink alcohol and does not use drugs.  ROS: All other review of systems were reviewed and are negative except what is noted above in HPI  Physical Exam: BP 139/74   Pulse 72   Constitutional:  Alert and oriented, No acute distress. HEENT: [place] AT,  moist mucus membranes.  Trachea midline, no masses. Cardiovascular: No clubbing, cyanosis, or edema. Respiratory: Normal respiratory effort, no increased work of breathing. GI: Abdomen is soft, nontender, nondistended, no abdominal masses GU: No CVA tenderness.  Lymph: No cervical or inguinal lymphadenopathy. Skin: No rashes, bruises or suspicious lesions. Neurologic: Grossly intact, no focal deficits, moving all 4 extremities. Psychiatric: Normal mood and affect.  Laboratory Data: Lab Results  Component Value Date   WBC 4.1 03/09/2023   HGB 13.6 03/09/2023   HCT 39.6 03/09/2023   MCV 91.2 03/09/2023   PLT 151.0 03/09/2023    Lab Results  Component Value Date   CREATININE 1.02 03/09/2023    Lab Results  Component Value Date   PSA 3.85 05/01/2019   PSA 3.91 11/09/2018   PSA 2.79 06/12/2015    No results found for: TESTOSTERONE  Lab Results  Component Value Date   HGBA1C 5.3 03/09/2023    Urinalysis    Component Value Date/Time   APPEARANCEUR Clear 03/15/2023 0947   GLUCOSEU Negative 03/15/2023 0947   BILIRUBINUR Negative 03/15/2023 0947   PROTEINUR Negative 03/15/2023 0947   NITRITE Negative 03/15/2023 0947   LEUKOCYTESUR Negative 03/15/2023 0947    Lab Results  Component Value Date   LABMICR Comment 03/15/2023    Pertinent Imaging:  No results found for this or any previous visit.  No results found for this or any previous visit.  No results found for this or any previous visit.  No results found for this or any previous visit.  No results found for this or any previous visit.  No results found for this or any previous visit.  No results found for this or any previous visit.  No results found for this or any previous visit.   Assessment & Plan:    1. Benign prostatic hyperplasia, unspecified whether lower urinary tract symptoms present (Primary) -patient defers therapy at this time - Urinalysis, Routine w reflex microscopic  2. Weak  urinary stream -patient defers therapy at this time   No follow-ups on file.  Belvie Clara, MD  [facility] [place]      [1] No Known Allergies  "

## 2024-03-15 NOTE — Progress Notes (Signed)
 Patient blood pressure value first attempt is high. A repeat blood pressure check completed.  Does patient have a PCP? Yes Has the patient taken then blood pressure medicine today? Yes  Discussed with patient the importance of reviewing their blood pressure with PCP and adhering to their blood medication is the patient is prescribed any. Patient voiced understanding.

## 2024-03-15 NOTE — Patient Instructions (Signed)

## 2024-03-29 ENCOUNTER — Ambulatory Visit: Payer: Medicare PPO
